# Patient Record
Sex: Male | Born: 1980 | Hispanic: Yes | Marital: Single | Smoking: Never smoker
Health system: Southern US, Community
[De-identification: ages and names within clinical notes are randomized; demographics above are authoritative.]

## PROBLEM LIST (undated history)

## (undated) DIAGNOSIS — N289 Disorder of kidney and ureter, unspecified: Secondary | ICD-10-CM

## (undated) DIAGNOSIS — F419 Anxiety disorder, unspecified: Secondary | ICD-10-CM

## (undated) HISTORY — PX: OTHER SURGICAL HISTORY: SHX169

## (undated) HISTORY — PX: CLEFT PALATE REPAIR: SUR1165

---

## 2013-12-01 ENCOUNTER — Encounter (HOSPITAL_COMMUNITY): Payer: Self-pay | Admitting: Emergency Medicine

## 2013-12-01 ENCOUNTER — Emergency Department (HOSPITAL_COMMUNITY): Payer: Self-pay

## 2013-12-01 ENCOUNTER — Emergency Department (HOSPITAL_COMMUNITY)
Admission: EM | Admit: 2013-12-01 | Discharge: 2013-12-01 | Disposition: A | Discharge status: Home / Self Care | Payer: Self-pay | Attending: Emergency Medicine | Admitting: Emergency Medicine

## 2013-12-01 DIAGNOSIS — F419 Anxiety disorder, unspecified: Secondary | ICD-10-CM

## 2013-12-01 DIAGNOSIS — R079 Chest pain, unspecified: Secondary | ICD-10-CM

## 2013-12-01 DIAGNOSIS — R000 Tachycardia, unspecified: Secondary | ICD-10-CM | POA: Insufficient documentation

## 2013-12-01 DIAGNOSIS — Z79899 Other long term (current) drug therapy: Secondary | ICD-10-CM | POA: Insufficient documentation

## 2013-12-01 DIAGNOSIS — F411 Generalized anxiety disorder: Secondary | ICD-10-CM | POA: Insufficient documentation

## 2013-12-01 DIAGNOSIS — R0789 Other chest pain: Secondary | ICD-10-CM | POA: Insufficient documentation

## 2013-12-01 HISTORY — DX: Anxiety disorder, unspecified: F41.9

## 2013-12-01 LAB — TSH: TSH: 2.48 u[IU]/mL (ref 0.350–4.500)

## 2013-12-01 LAB — CBC WITH DIFFERENTIAL/PLATELET
Basophils Absolute: 0 10*3/uL (ref 0.0–0.1)
Basophils Relative: 0 % (ref 0–1)
Eosinophils Absolute: 0 10*3/uL (ref 0.0–0.7)
Eosinophils Relative: 1 % (ref 0–5)
HEMATOCRIT: 40.9 % (ref 39.0–52.0)
HEMOGLOBIN: 14 g/dL (ref 13.0–17.0)
LYMPHS PCT: 15 % (ref 12–46)
Lymphs Abs: 1 10*3/uL (ref 0.7–4.0)
MCH: 30.1 pg (ref 26.0–34.0)
MCHC: 34.2 g/dL (ref 30.0–36.0)
MCV: 88 fL (ref 78.0–100.0)
MONO ABS: 0.6 10*3/uL (ref 0.1–1.0)
MONOS PCT: 9 % (ref 3–12)
NEUTROS ABS: 5 10*3/uL (ref 1.7–7.7)
NEUTROS PCT: 75 % (ref 43–77)
Platelets: 293 10*3/uL (ref 150–400)
RBC: 4.65 MIL/uL (ref 4.22–5.81)
RDW: 14.2 % (ref 11.5–15.5)
WBC: 6.6 10*3/uL (ref 4.0–10.5)

## 2013-12-01 LAB — BASIC METABOLIC PANEL
BUN: 12 mg/dL (ref 6–23)
CHLORIDE: 98 meq/L (ref 96–112)
CO2: 27 meq/L (ref 19–32)
Calcium: 9.1 mg/dL (ref 8.4–10.5)
Creatinine, Ser: 0.8 mg/dL (ref 0.50–1.35)
GFR calc Af Amer: >90 mL/min (ref >90)
GFR calc non Af Amer: >90 mL/min (ref >90)
Glucose, Bld: 105 mg/dL — ABNORMAL HIGH (ref 70–99)
Potassium: 4.1 mEq/L (ref 3.7–5.3)
Sodium: 137 mEq/L (ref 137–147)

## 2013-12-01 LAB — I-STAT TROPONIN, ED: TROPONIN I, POC: 0 ng/mL (ref 0.00–0.08)

## 2013-12-01 LAB — T4, FREE: FREE T4: 1.27 ng/dL (ref 0.80–1.80)

## 2013-12-01 MED ORDER — LORAZEPAM 1 MG PO TABS
1.0000 mg | ORAL_TABLET | Freq: Once | ORAL | Status: AC
  Administered 2013-12-01: 1 mg via ORAL
  Filled 2013-12-01: qty 1

## 2013-12-01 MED ORDER — LORAZEPAM 1 MG PO TABS: 1.0000 mg | ORAL_TABLET | Freq: Two times a day (BID) | ORAL | Status: DC

## 2013-12-01 NOTE — ED Notes (Signed)
PA at bedside.

## 2013-12-01 NOTE — Discharge Instructions (Signed)
Follow up with [facility] in 2 days, or establish a primary care doctor from the resource guide attached below. Take medication as directed for your anxiety. Caution with driving on medication due to sedative effects. Return to Emergency department if you develop worsening symptom, chest pain, shortness of breath, or dizziness.    Chest Pain (Nonspecific) Chest pain has many causes. Your pain could be caused by something serious, such as a heart attack or a blood clot in the lungs. It could also be caused by something less serious, such as a chest bruise or a virus. Follow up with your doctor. More lab tests or other studies may be needed to find the cause of your pain. Most of the time, nonspecific chest pain will improve within 2 to 3 days of rest and mild pain medicine. HOME CARE  For chest bruises, you may put ice on the sore area for 15-20 minutes, 03-04 times a day. Do this only if it makes you feel better.  Put ice in a plastic bag.  Place a towel between the skin and the bag.  Rest for the next 2 to 3 days.  Go back to work if the pain improves.  See your doctor if the pain lasts longer than 1 to 2 weeks.  Only take medicine as told by your doctor.  Quit smoking if you smoke. GET HELP RIGHT AWAY IF:   There is more pain or pain that spreads to the arm, neck, jaw, back, or belly (abdomen).  You have shortness of breath.  You cough more than usual or cough up blood.  You have very bad back or belly pain, feel sick to your stomach (nauseous), or throw up (vomit).  You have very bad weakness.  You pass out (faint).  You have a fever. Any of these problems may be serious and may be an emergency. Do not wait to see if the problems will go away. Get medical help right away. Call your local emergency services 911 in [place] Do not drive yourself to the hospital. MAKE SURE YOU:   Understand these instructions.  Will watch this condition.  Will  get help right away if you or your child is not doing well or gets worse. Document Released: 02/10/2008 Document Revised: 11/16/2011 Document Reviewed: 02/10/2008 [facility] Patient Information 2014 [place], [place].  Panic Attacks Panic attacks are sudden, short-livedsurges of severe anxiety, fear, or discomfort. They may occur for no reason when you are relaxed, when you are anxious, or when you are sleeping. Panic attacks may occur for a number of reasons:   Healthy people occasionally have panic attacks in extreme, life-threatening situations, such as war or natural disasters. Normal anxiety is a protective mechanism of the body that helps [place] react to danger (fight or flight response).  Panic attacks are often seen with anxiety disorders, such as panic disorder, social anxiety disorder, generalized anxiety disorder, and phobias. Anxiety disorders cause excessive or uncontrollable anxiety. They may interfere with your relationships or other life activities.  Panic attacks are sometimes seen with other mental illnesses such as depression and posttraumatic stress disorder.  Certain medical conditions, prescription medicines, and drugs of abuse can cause panic attacks. SYMPTOMS  Panic attacks start suddenly, peak within 20 minutes, and are accompanied by four or more of the following symptoms:  Pounding heart or fast heart rate (palpitations).  Sweating.  Trembling or shaking.  Shortness of breath or feeling smothered.  Feeling choked.  Chest pain  or discomfort.  Nausea or strange feeling in your stomach.  Dizziness, lightheadedness, or feeling like you will faint.  Chills or hot flushes.  Numbness or tingling in your lips or hands and feet.  Feeling that things are not real or feeling that you are not yourself.  Fear of losing control or going crazy.  Fear of dying. Some of these symptoms can mimic serious medical conditions. For example, you may think you are having a heart  attack. Although panic attacks can be very scary, they are not life threatening. DIAGNOSIS  Panic attacks are diagnosed through an assessment by your health care provider. Your health care provider will ask questions about your symptoms, such as where and when they occurred. Your health care provider will also ask about your medical history and use of alcohol and drugs, including prescription medicines. Your health care provider may order blood tests or other studies to rule out a serious medical condition. Your health care provider may refer you to a mental health professional for further evaluation. TREATMENT   Most healthy people who have one or two panic attacks in an extreme, life-threatening situation will not require treatment.  The treatment for panic attacks associated with anxiety disorders or other mental illness typically involves counseling with a mental health professional, medicine, or a combination of both. Your health care provider will help determine what treatment is best for you.  Panic attacks due to physical illness usually goes away with treatment of the illness. If prescription medicine is causing panic attacks, talk with your health care provider about stopping the medicine, decreasing the dose, or substituting another medicine.  Panic attacks due to alcohol or drug abuse goes away with abstinence. Some adults need professional help in order to stop drinking or using drugs. HOME CARE INSTRUCTIONS   Take all your medicines as prescribed.   Check with your health care provider before starting new prescription or over-the-counter medicines.  Keep all follow up appointments with your health care provider. SEEK MEDICAL CARE IF:  You are not able to take your medicines as prescribed.  Your symptoms do not improve or get worse. SEEK IMMEDIATE MEDICAL CARE IF:   You experience panic attack symptoms that are different than your usual symptoms.  You have serious thoughts  about hurting yourself or others.  You are taking medicine for panic attacks and have a serious side effect. MAKE SURE YOU:  Understand these instructions.  Will watch your condition.  Will get help right away if you are not doing well or get worse. Document Released: 08/24/2005 Document Revised: 06/14/2013 Document Reviewed: 04/07/2013 [facility] Patient Information 2014 [place].

## 2013-12-01 NOTE — ED Notes (Signed)
Pt states on Wednesday he felt like his heart was racing and he felt SOB. He states "my doctor thought i was having a panic attack, he told me to come here on Wednesday but i didn't." states he decided to come today instead. Denies pain or other complaints now.

## 2013-12-01 NOTE — ED Provider Notes (Signed)
CSN: [id number]     Arrival date & time 12/01/13  1112 History   First MD Initiated Contact with Patient 12/01/13 1126     Chief Complaint  Patient presents with  . Tachycardia     (Consider location/radiation/quality/duration/timing/severity/associated sxs/prior Treatment) HPI 33 yo male with hx of anxiety presents with c/o tachycardia. Patient states he felt that he had a "panic attack" on Wednesday 2 days ago with associated feelings of overwhelming fear and "desperation" as well as "dizziness". Patient states he has been having intermittent chest pain x 1 weeks that is worse "when I move, or cough, or carry bags". Patient Denies any associated dizziness or SOB with his episodes of chest pain. Pain rated at 4/10. Pain improved with zantac.  Patient does state he has been feeling more stressed as of late. "quite a bit on my plate". Denies N/V, diaphoresis, abdominal pain, or HA.   CAD Risk Factors: HTN: No Hyperlipidemia: No Cigarette smoking: No Diabetes Mellitus: No Family hx of CAD or MI < 32 yo : No Cocaine Use: No Heart Score: ( a score 0-3 are low risk with less than 2% risk of MACE at 6 weeks.) Suspicion: 0 Suspicious +2 Moderately suspicious  +1  Slightly suspicious  +0  EKG:0 Significant ST-depression  +2 Non-specific repolarisation disturbance  +1  Normal  +0 AGE:34 > 65   +2  45-65  +1  < 45  +0  Risk Factors:0 (Hypercholesterolemia,HTN, DM, Cigarette smoking, Positive Family hx, Obesity) > 3 risk factors  +2  1-2 risk factors  +1 No known risk factors  +0  Troponin:0 > 3 x normal limit +2  1-3 x normal limit +1 < normal limit  +0   Total: 0   Well's Criteria: Suspect DVT : (3) No PE most likely: (3) No HR > 100: (1.5) No Immobilization > 3 days or Surgery w/in 4 wks: (1.5) No Hx of DVT/PE: (1.5) No Hemoptysis: (1) No Hx of Cancer with tx w/in 6 mo: (1) No Total: 0  PERC Criteria: Age > 71 yo: No HR > 100 bpm: Yes O2 sat on RA < 95%: No Prior  hx of DVT/PE:No Trauma or surgery in past 4 wks:No Hemoptysis:No Exogenous Estrogen/Testosterone use:No Unilateral Leg swelling: No      Past Medical History  Diagnosis Date  . Anxiety    Past Surgical History  Procedure Laterality Date  . Cleft palate repair     History reviewed. No pertinent family history. History  Substance Use Topics  . Smoking status: Never Smoker   . Smokeless tobacco: Not on file  . Alcohol Use: Yes    Review of Systems  All other systems reviewed and are negative.      Allergies  Review of patient's allergies indicates no known allergies.  Home Medications   Current Outpatient Rx  Name  Route  Sig  Dispense  Refill  . Multiple Vitamins-Minerals (MULTIVITAL) tablet   Oral   Take 1 tablet by mouth daily.         . ranitidine (ZANTAC) 150 MG tablet   Oral   Take 150 mg by mouth 2 (two) times daily.         Marland Kitchen VALERIAN ROOT PO   Oral   Take 1 tablet by mouth 3 (three) times daily as needed (relaxation).         . LORazepam (ATIVAN) 1 MG tablet   Oral   Take 1 tablet (1 mg total) by mouth 2 (two)  times daily.   10 tablet   0    BP 138/95  Pulse 109  Temp(Src) 98.3 F (36.8 C) (Oral)  Resp 20  SpO2 99% Physical Exam  Nursing note and vitals reviewed. Constitutional: He is oriented to person, place, and time. He appears well-developed and well-nourished. No distress.  HENT:  Head: Normocephalic and atraumatic.  Right Ear: Tympanic membrane and ear canal normal.  Left Ear: Tympanic membrane and ear canal normal.  Nose: Nose normal. Right sinus exhibits no maxillary sinus tenderness and no frontal sinus tenderness. Left sinus exhibits no maxillary sinus tenderness and no frontal sinus tenderness.  Mouth/Throat: Uvula is midline, oropharynx is clear and moist and mucous membranes are normal. No oropharyngeal exudate, posterior oropharyngeal edema or posterior oropharyngeal erythema.  Eyes: Conjunctivae and EOM are normal.  Pupils are equal, round, and reactive to light. Right eye exhibits no discharge. Left eye exhibits no discharge. No scleral icterus.  Neck: Trachea normal, normal range of motion and phonation normal. Neck supple. No JVD present. Carotid bruit is not present. No rigidity. No tracheal deviation, no edema and no erythema present.  Cardiovascular: Normal rate and regular rhythm.  Exam reveals no gallop and no friction rub.   No murmur heard. Pulmonary/Chest: Effort normal and breath sounds normal. No stridor. No respiratory distress. He has no wheezes. He has no rhonchi. He has no rales.  Abdominal: Soft. He exhibits no distension. There is no tenderness.  Musculoskeletal: Normal range of motion. He exhibits no edema.  Lymphadenopathy:    He has no cervical adenopathy.  Neurological: He is alert and oriented to person, place, and time. He has normal strength. No cranial nerve deficit or sensory deficit.  CN II-XII grossly intact. Cerebellar function appears intact with finger to nose.   Skin: Skin is warm and dry. He is not diaphoretic.  Psychiatric: He has a normal mood and affect. His behavior is normal.    ED Course  Procedures (including critical care time) Labs Review Labs Reviewed  BASIC METABOLIC PANEL - Abnormal; Notable for the following:    Glucose, Bld 105 (*)    All other components within normal limits  CBC WITH DIFFERENTIAL  TSH  T4, FREE  I-STAT TROPOININ, ED   Imaging Review Dg Chest 2 View  12/01/2013   CLINICAL DATA:  Anxiety.  EXAM: CHEST  2 VIEW  COMPARISON:  None.  FINDINGS: Mediastinum and hilar structures are normal. The lungs are clear of acute infiltrates. Mild interstitial prominence most likely related to scarring. Elevation of the left hemidiaphragm is noted. No pleural effusion noted. No pneumothorax. Heart size and pulmonary vascularity normal. No acute osseous abnormality.  IMPRESSION: 1. Minimal interstitial prominence  most likely scarring. 2. Mild elevation  left hemidiaphragm. No pleural effusion identified. No acute cardiopulmonary disease identified.   Electronically Signed   By: Marcello Moores  Register   On: 12/01/2013 15:05     EKG Interpretation None      MDM   Final diagnoses:  Racing heart beat  Chest pain  Anxiety   Troponin negative No anemia  TSH/T4 are WNL Heart Score = 0. Doubt ACS.  Patient is low risk for PE by Well's Criteria. Doubt PE. CXR shows no evidence of widened mediastinum, pneumoperitoneum, or pneumomediastinum. Doubt aortic dissection or esophageal rupture. no Cardiomegaly on CXR and no evidence of low voltage on EKG. Doubt Pericardial tamponade.  Patient has hx of anxiety and appears anxious on exam. Suspect patient sxs likely related to underlying anxiety.  Discussed labs, and exam findings with patient. Advised follow up with [place] in 2 days. Recommend return to ED should symptoms worsen progressive chest pain, SOB, dizziness, or fever/chills. Patient agrees with plan. Discharged in good condition.     Meds given in ED:  Medications  LORazepam (ATIVAN) tablet 1 mg (1 mg Oral Given 12/01/13 1440)    Discharge Medication List as of 12/01/2013  4:24 PM    START taking these medications   Details  LORazepam (ATIVAN) 1 MG tablet Take 1 tablet (1 mg total) by mouth 2 (two) times daily., Starting 12/01/2013, Until Discontinued, Print          Dossie Arbour [place], [place] 12/02/13 [phone]

## 2013-12-05 NOTE — ED Provider Notes (Signed)
Medical screening examination/treatment/procedure(s) were performed by non-physician practitioner and as supervising physician I was immediately available for consultation/collaboration.   EKG Interpretation   Date/Time:  Friday December 01 2013 15:46:43 EDT Ventricular Rate:  102 PR Interval:  124 QRS Duration: 85 QT Interval:  326 QTC Calculation: 425 R Axis:   58 Text Interpretation:  Sinus tachycardia Probable left atrial enlargement  RSR' in V1 or V2, right VCD or RVH ED PHYSICIAN INTERPRETATION AVAILABLE  IN [facility] Confirmed by TEST, Record ([id number]) on 12/03/2013 12:51:26  PM        EKG Interpretation   Date/Time:  Friday December 01 2013 15:46:43 EDT Ventricular Rate:  102 PR Interval:  124 QRS Duration: 85 QT Interval:  326 QTC Calculation: 425 R Axis:   58 Text Interpretation:  Sinus tachycardia Probable left atrial enlargement  RSR' in V1 or V2, right VCD or RVH ED PHYSICIAN INTERPRETATION AVAILABLE  [facility] Confirmed by TEST, Record ([id number]) on 12/03/2013 12:51:26  PM        Tanna Furry, MD 12/05/13 [id number]

## 2015-11-05 ENCOUNTER — Emergency Department (HOSPITAL_COMMUNITY)
Admission: EM | Admit: 2015-11-05 | Discharge: 2015-11-05 | Disposition: A | Discharge status: Home / Self Care | Payer: Self-pay | Attending: Emergency Medicine | Admitting: Emergency Medicine

## 2015-11-05 ENCOUNTER — Encounter (HOSPITAL_COMMUNITY): Payer: Self-pay | Admitting: Emergency Medicine

## 2015-11-05 DIAGNOSIS — Z79899 Other long term (current) drug therapy: Secondary | ICD-10-CM | POA: Insufficient documentation

## 2015-11-05 DIAGNOSIS — T148XXA Other injury of unspecified body region, initial encounter: Secondary | ICD-10-CM

## 2015-11-05 DIAGNOSIS — Z791 Long term (current) use of non-steroidal anti-inflammatories (NSAID): Secondary | ICD-10-CM | POA: Insufficient documentation

## 2015-11-05 DIAGNOSIS — F419 Anxiety disorder, unspecified: Secondary | ICD-10-CM | POA: Insufficient documentation

## 2015-11-05 DIAGNOSIS — Y9389 Activity, other specified: Secondary | ICD-10-CM | POA: Insufficient documentation

## 2015-11-05 DIAGNOSIS — X58XXXA Exposure to other specified factors, initial encounter: Secondary | ICD-10-CM | POA: Insufficient documentation

## 2015-11-05 DIAGNOSIS — S39011A Strain of muscle, fascia and tendon of abdomen, initial encounter: Secondary | ICD-10-CM | POA: Insufficient documentation

## 2015-11-05 DIAGNOSIS — Y9289 Other specified places as the place of occurrence of the external cause: Secondary | ICD-10-CM | POA: Insufficient documentation

## 2015-11-05 DIAGNOSIS — Y998 Other external cause status: Secondary | ICD-10-CM | POA: Insufficient documentation

## 2015-11-05 LAB — CBC
HEMATOCRIT: 39.9 % (ref 39.0–52.0)
Hemoglobin: 14.1 g/dL (ref 13.0–17.0)
MCH: 29.7 pg (ref 26.0–34.0)
MCHC: 35.3 g/dL (ref 30.0–36.0)
MCV: 84.2 fL (ref 78.0–100.0)
PLATELETS: 324 10*3/uL (ref 150–400)
RBC: 4.74 MIL/uL (ref 4.22–5.81)
RDW: 13.8 % (ref 11.5–15.5)
WBC: 5.9 10*3/uL (ref 4.0–10.5)

## 2015-11-05 LAB — COMPREHENSIVE METABOLIC PANEL
ALT: 37 U/L (ref 17–63)
ANION GAP: 11 (ref 5–15)
AST: 29 U/L (ref 15–41)
Albumin: 4.4 g/dL (ref 3.5–5.0)
Alkaline Phosphatase: 47 U/L (ref 38–126)
BILIRUBIN TOTAL: 0.8 mg/dL (ref 0.3–1.2)
BUN: 14 mg/dL (ref 6–20)
CO2: 24 mmol/L (ref 22–32)
Calcium: 9.7 mg/dL (ref 8.9–10.3)
Chloride: 104 mmol/L (ref 101–111)
Creatinine, Ser: 0.94 mg/dL (ref 0.61–1.24)
GFR calc non Af Amer: >60 mL/min (ref >60)
Glucose, Bld: 99 mg/dL (ref 65–99)
Potassium: 3.9 mmol/L (ref 3.5–5.1)
Sodium: 139 mmol/L (ref 135–145)
Total Protein: 7.3 g/dL (ref 6.5–8.1)

## 2015-11-05 LAB — LIPASE, BLOOD: Lipase: 31 U/L (ref 11–51)

## 2015-11-05 MED ORDER — NAPROXEN 500 MG PO TABS: 500.0000 mg | ORAL_TABLET | Freq: Two times a day (BID) | ORAL | Status: DC

## 2015-11-05 NOTE — Discharge Instructions (Signed)
Take your medications as prescribed. You may also apply ice or heat to affected area for 15-20 minutes 3-4 times daily to help with pain. Refrain from doing any heavy lifting, excessive exercising or repetitive movements that exacerbate her symptoms. Please follow up with a primary care provider from the Resource Guide provided below in 3-4 days as needed. Please return to the Emergency Department if symptoms worsen or new onset of fever, abdominal pain, vomiting, diarrhea, constipation, blood in urine or stool.   Emergency Department Resource Guide 1) Find a Doctor and Pay Out of Pocket Although you won't have to find out who is covered by your insurance plan, it is a good idea to ask around and get recommendations. You will then need to call the office and see if the doctor you have chosen will accept you as a new patient and what types of options they offer for patients who are self-pay. Some doctors offer discounts or will set up payment plans for their patients who do not have insurance, but you will need to ask so you aren't surprised when you get to your appointment.  2) Contact Your Local Health Department Not all health departments have doctors that can see patients for sick visits, but many do, so it is worth a call to see if yours does. If you don't know where your local health department is, you can check in your phone book. The CDC also has a tool to help you locate your state's health department, and many state websites also have listings of all of their local health departments.  3) Find a [facility] If your illness is not likely to be very severe or complicated, you may want to try a walk in clinic. These are popping up all over the country in pharmacies, drugstores, and shopping centers. They're usually staffed by nurse practitioners or physician assistants that have been trained to treat common illnesses and complaints. They're usually fairly quick and inexpensive. However, if you  have serious medical issues or chronic medical problems, these are probably not your best option.  No Primary Care Doctor: - Call Health Connect at  [phone] - they can help you locate a primary care doctor that  accepts your insurance, provides certain services, etc. - Physician Referral Service- [phone]  Chronic Pain Problems: Organization         Address  Phone   Notes  [facility]  [phone] Patients need to be referred by their primary care doctor.   Medication Assistance: Organization         Address  Phone   Notes  [facility] Medication [facility] [address] [place], [place] [address] [phone] --Must be a resident of [facility] -- Must have NO insurance coverage whatsoever (no Medicaid/ Medicare, etc.) -- The pt. MUST have a primary care doctor that directs their care regularly and follows them in the community   [facility]ist  [phone]   [employer]  [phone]    Agencies that provide inexpensive medical care: Organization         Address  Phone   Notes  [facility]  [phone]   [facility]    [phone]   [facility] [place], [place] [address] [phone]   [place] [address] [place] [phone]   Planned Parenthood    [phone]   [place]  [facility]    [phone]   [facility] and [facility]  [address], [place] Phone:  [phone], Fax:  [phone] Hours of Operation:  9 am - 6 pm, M-F.  Also accepts Medicaid/Medicare and self-pay.  [facility] for [place], [address], [place] Phone: [phone], Fax: [phone]. Hours of Operation:  8:30 am - 5:30 pm, M-F.  Also accepts Medicaid and self-pay.  [facility] [place] [address] [place] Phone: [phone]   [place], [place], [place] [phone], Ext. [phone] Mondays & Thursdays: 7-9 AM.  First 15 patients are seen on a first come, first serve basis.    [place] Providers:  Organization         Address  Phone   Notes  [facility] [address], [place] [phone] Also accepts self-pay patients.  [facility] [id number] [place], [place]  [phone]   [place], [address], [place] [phone]   [facility] [address], [place] [phone]   Lucianne Lei [address], [place]   [phone] Only accepts [place] Access [place] patients after they have their name applied to their card.   Self-Pay (no insurance) in [facility]:  Organization         Address  Phone   Notes  Sickle Cell Patients, [facility] [place] [phone]   [facility] [place] [phone]   [facility] [place]  [place], [place], [place] [phone]   [facility]/Dr. Osei-Bonsu  [address] [place] or [address], [place] [phone] Phone number for both [address] and [place] locations is the same.  [facility]gent [facility] and [facility] [address] [place] [phone]   [facility] [address] [place] or [address] [phone]   [facility] [address], [place] [phone], phone; [phone], fax Sees patients 1st and 3rd Saturday of every month.  Must not qualify for public or private insurance (i.e. Medicaid, Medicare, [place] Health Choice, Veterans' Benefits)  Household income should be no more than 200% of the poverty level The clinic cannot treat you if you are pregnant or think you are pregnant   Sexually transmitted diseases are not treated at the clinic.    Dental Care: Organization         Address  Phone  Notes  [facility] Department of [facility] [place] [phone] Accepts children up to age 64 who are enrolled in [place] or [place]; pregnant women with a Medicaid card; and children who have applied for Medicaid or [place] Health Choice, but were declined, whose parents can pay a reduced fee at time of service.  [facility] Department of [facility]  [address], [place] [phone] Accepts children up to age 6 who are enrolled in [place] or [place]; pregnant women with a Medicaid card; and children who have applied for Medicaid or [place] Health Choice, but were declined, whose parents can pay a reduced fee at time of  service.  [facility]  [place] [phone] Patients are seen by appointment only. Walk-ins are not accepted. [place] will see patients 58 years of age and older. Monday - Tuesday (8am-5pm) Most Wednesdays (8:30-5pm) $30 per visit, cash only  [facility] Adult Dental Access PROGRAM  [address], [facility] [phone] Patients are seen by appointment only. Walk-ins are not accepted. [place] will see patients 28 years of age and older. One Wednesday Evening (Monthly: Volunteer Based).  $30 per visit, cash only  [place]  [phone] for adults; Children under age 3, call Graduate Pediatric Dentistry at [phone]. Children aged 84-14, please call [phone] to request a pediatric application.  Dental services are provided in all areas of dental care including fillings, crowns and bridges, complete and partial dentures, implants, gum treatment, root canals, and extractions. Preventive care is also provided. Treatment is provided to both adults and children. Patients  are selected via a lottery and there is often a waiting list.   [facility] [address], [place]  [phone] [email or web address]   [facility] [address] [place], [place] [phone], Ext. [phone] Second and Fourth Thursday of each month, opens at 6:30 AM; Clinic ends at 9 AM.  Patients are seen on a first-come first-served basis, and a limited number are seen during each clinic.   [facility]  [address] [place] Danker [place], [place] [phone]   Eligibility Requirements You must have lived in [place], [place], or [place] counties for at least the last three months.   You cannot be eligible for state or federal sponsored [employer], including [employer], [place], or [employer].   You generally cannot be eligible for healthcare insurance through your employer.    How to apply: Eligibility screenings are held every Tuesday and Wednesday afternoon from 1:00 pm until 4:00 pm. You do not need an appointment for the interview!  [facility] [address], [place], [place]   [place]  [place] Department  [place]  [phone]    Behavioral Health Resources in the Community: Intensive Outpatient Programs Organization         Address  Phone  Notes  [place]. [address] [place], [place] [phone]   [facility] [address], [place], [place]   ADS: Alcohol & Drug S[facility]cs [address], [place], [place]   [place] [address],  [place], [place] or [phone]   Substance Abuse Resources Organization         Address  Phone  Notes  Alcohol and Drug Services  [phone]   [place]  [phone]   [place]   Chinita Pester  [phone]    Residential & Outpatient Substance Abuse Program  [phone]   Psychological Services Organization         Address  Phone  Notes  [facility] [place]  [place]  [phone]   Central [address] [place] [phone] or [phone]    Mobile Crisis Teams Organization         Address  Phone  Notes  [facility], Mobile Crisis Care Unit  [phone]   Assertive Psychotherapeutic Services  [place], [place]   [facility] [address], [place] [phone]    Self-Help/Support Groups Organization         Address  Phone             Notes  [place]. of [place] - variety of support groups  [place] Call for more information  Narcotics Anonymous (NA), [facility] [address], [employer] [place]  2 meetings at this location   Special educational needs teacher         Address  Phone  Notes  [facility] Residential Treatment [place],    [place]  [phone]   [facility]  [address], [place] [phone], [place], [place]   [place], [place] [phone] Admissions: 8am-3pm M-F  Incentives Substance [place] [address],    [place], [place] [phone]   [facility] [address] [place], [place], [place]   [facility] [address]  [place], [place]   [facility] - Intensive Outpatient [address] Kristeen [address], [place], [place]   [employer].,  [place], [place] [phone] or [phone]   [facility] Services (RTS) [address], [place], [place] Accepts Medicaid  [address] [place] [place] [address],  [place] [phone] Substance Abuse/Addiction Treatment   [facility] Organization         Address  Phone  Notes  [facility]  [phone]   Domenic Schwab, PhD [address] Arlis [place], [place]   [phone] or [phone]   [place], [place] [phone]   [facility] [address], [place], [place] [phone] Insurance/Medicaid/sponsorship through [facility] Families [address], [address] [place]                                    [place], [place] [phone] [place] [address] [place] [address], [place] [phone]    Dr. Adele Schilder  [phone]   [facility] Dept. 1) [address] [place] 2) [place] 3)  [place] [address], [place] [phone]  [phone]   [place] [phone] or [phone] (After Hours)

## 2015-11-05 NOTE — ED Provider Notes (Signed)
CSN: [id number]     Arrival date & time 11/05/15  1314 History  By signing my name below, I, Jamie Rivers, attest that this documentation has been prepared under the direction and in the presence of Jamie Rivers, [place]. Electronically Signed: Stephania Rivers, ED Scribe. 11/05/2015. 4:30 PM.    Chief Complaint  Patient presents with  . Abdominal Pain   The history is provided by the patient. No language interpreter was used.   HPI Comments:  Jamie Rivers is a 35 y.o. male with a history of anxiety, who presents to the Emergency Department complaining of dull, occasionally throbbing, moderate LUQ abdominal pain occasionally radiating into his left back that began 1 months ago. He states the pain started out as intermittent but became constant over the past week. Eating does not seem to affect his symptoms. Position changes sometimes alleviate his symptoms. He denies any trauma or injury to the area, recent heavy lifting, or new exercises. 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 Patient states he has been able to eat and drink normally, and his last normal BM occurred late this morning. He notes he has been taking a daily multivitamin and supplementary OTC digestive enzymes. He denies a history of abdominal surgeries. He also denies a history of any chronic medical conditions, including DM, or any regular medications. He denies nausea, vomiting, fever, chills, generalized myalgias, chest pain, SOB, diarrhea, constipation, dysuria, hematuria, or blood in his stool, numbness, tingling or weakness.  PCP: None  Past Medical History  Diagnosis Date  . Anxiety    Past Surgical History  Procedure Laterality Date  . Cleft palate repair     No family history on file. Social History  Substance Use Topics  . Smoking status: Never Smoker   . Smokeless tobacco: None  . Alcohol Use: Yes    Review of Systems  Constitutional: Negative for fever and chills.  Respiratory: Negative for shortness of breath.    Cardiovascular: Negative for chest pain.  Gastrointestinal: Positive for abdominal pain. Negative for nausea, vomiting, diarrhea, constipation and blood in stool.  Genitourinary: Negative for dysuria and hematuria.  Musculoskeletal: Negative for myalgias.  All other systems reviewed and are negative.     Allergies  Review of patient's allergies indicates no known allergies.  Home Medications   Prior to Admission medications   Medication Sig Start Date End Date Taking? Authorizing Provider  LORazepam (ATIVAN) 1 MG tablet Take 1 tablet (1 mg total) by mouth 2 (two) times daily. 12/01/13   Maude Leriche, PA-C  Multiple Vitamins-Minerals (MULTIVITAL) tablet Take 1 tablet by mouth daily.    Historical Provider, MD  naproxen (NAPROSYN) 500 MG tablet Take 1 tablet (500 mg total) by mouth 2 (two) times daily. 11/05/15   Nona Dell, PA-C  ranitidine (ZANTAC) 150 MG tablet Take 150 mg by mouth 2 (two) times daily.    Historical Provider, MD  VALERIAN ROOT PO Take 1 tablet by mouth 3 (three) times daily as needed (relaxation).    Historical Provider, MD   BP 111/75 mmHg  Pulse 91  Temp(Src) 98.3 F (36.8 C) (Oral)  Resp 16  SpO2 100% Physical Exam  Constitutional: He is oriented to person, place, and time. He appears well-developed and well-nourished.  HENT:  Head: Normocephalic and atraumatic.  Eyes: Conjunctivae and EOM are normal. Right eye exhibits no discharge. Left eye exhibits no discharge. No scleral icterus.  Cardiovascular: Normal rate, regular rhythm, normal heart sounds and intact distal pulses.  Pulmonary/Chest: Effort normal and breath sounds normal.  Abdominal: Soft. Bowel sounds are normal. There is no tenderness.  Musculoskeletal: He exhibits tenderness. He exhibits no edema.  Left lateral external oblique mildly TTP.  Neurological: He is alert and oriented to person, place, and time.  Skin: Skin is warm and dry.  Nursing note and vitals reviewed.   ED  Course  Procedures (including critical care time)  DIAGNOSTIC STUDIES: Oxygen Saturation is 99% on RA, normal by my interpretation.    COORDINATION OF CARE: 3:37 PM - Lab results reviewed with pt. Suspect muscle strain. Discussed treatment plan with pt at bedside which includes ibuprofen. Discouraged heavy lifting or strenuous activity. Will also give referral to PCP; advised pt to make a follow-up visit with PCP if his pain does not improve in 1 week. Strict return precautions given, and pt instructed to return to the ED immediately if he develops any fever, nausea, vomiting, worsening abdominal pain, or diarrhea. Pt verbalized understanding and agreed to plan.   Labs Review Labs Reviewed  LIPASE, BLOOD  COMPREHENSIVE METABOLIC PANEL  CBC    MDM   Final diagnoses:  Muscle strain   Patient presents with left-sided abdominal pain. VSS. Exam revealed mild tenderness over left lateral external oblique, no abdominal tenderness, no peritoneal signs, remaining exam unremarkable. No back pain red flags. Labs unremarkable. I suspect patient's symptoms are likely due to to muscle strain and do not feel any further workup or imaging is warranted at this time. Discussed results and plan for discharge with symptomatic treatment with patient. Patient given resource guide to follow up with PCP.  Evaluation does not show pathology requring ongoing emergent intervention or admission. Pt is hemodynamically stable and mentating appropriately. Discussed findings/results and plan with patient/guardian, who agrees with plan. All questions answered. Return precautions discussed and outpatient follow up given.    I personally performed the services described in this documentation, which was scribed in my presence. The recorded information has been reviewed and is accurate.      Jamie Rivers, [place] 11/05/15 1913  Tanna Furry, MD 11/18/15 [phone]

## 2015-11-05 NOTE — ED Notes (Signed)
Pt states for the last month he has had intermittent left upper abd pain that over the last week and became more persistent. Pt denies any n/v/d. Pt also states he times the pain radiates into the left mid back.

## 2015-11-05 NOTE — ED Notes (Signed)
PA at bedside.

## 2016-04-02 ENCOUNTER — Ambulatory Visit (INDEPENDENT_AMBULATORY_CARE_PROVIDER_SITE_OTHER): Payer: Self-pay | Admitting: Urgent Care

## 2016-04-02 ENCOUNTER — Ambulatory Visit (INDEPENDENT_AMBULATORY_CARE_PROVIDER_SITE_OTHER): Payer: Self-pay

## 2016-04-02 VITALS — BP 110/78 | HR 84 | Temp 98.0°F | Resp 17 | Wt 108.0 lb

## 2016-04-02 DIAGNOSIS — D649 Anemia, unspecified: Secondary | ICD-10-CM

## 2016-04-02 DIAGNOSIS — R109 Unspecified abdominal pain: Secondary | ICD-10-CM

## 2016-04-02 LAB — POCT URINALYSIS DIP (MANUAL ENTRY)
BILIRUBIN UA: NEGATIVE
BILIRUBIN UA: NEGATIVE
Blood, UA: NEGATIVE
Glucose, UA: NEGATIVE
Leukocytes, UA: NEGATIVE
Nitrite, UA: NEGATIVE
Protein Ur, POC: NEGATIVE
SPEC GRAV UA: 1.01
Urobilinogen, UA: 0.2
pH, UA: 5.5

## 2016-04-02 LAB — COMPLETE METABOLIC PANEL WITH GFR
ALBUMIN: 4.7 g/dL (ref 3.6–5.1)
ALT: 30 U/L (ref 9–46)
AST: 23 U/L (ref 10–40)
Alkaline Phosphatase: 40 U/L (ref 40–115)
BUN: 15 mg/dL (ref 7–25)
CO2: 26 mmol/L (ref 20–31)
Calcium: 9.3 mg/dL (ref 8.6–10.3)
Chloride: 103 mmol/L (ref 98–110)
Creat: 1.08 mg/dL (ref 0.60–1.35)
GFR, Est African American: >89 mL/min (ref >=60)
GFR, Est Non African American: 88 mL/min (ref >=60)
Glucose, Bld: 79 mg/dL (ref 65–99)
POTASSIUM: 3.7 mmol/L (ref 3.5–5.3)
Sodium: 140 mmol/L (ref 135–146)
TOTAL PROTEIN: 7.6 g/dL (ref 6.1–8.1)
Total Bilirubin: 0.5 mg/dL (ref 0.2–1.2)

## 2016-04-02 LAB — POCT CBC
GRANULOCYTE PERCENT: 60 % (ref 37–80)
HEMATOCRIT: 37 % — AB (ref 43.5–53.7)
Hemoglobin: 13.1 g/dL — AB (ref 14.1–18.1)
Lymph, poc: 1.6 (ref 0.6–3.4)
MCH, POC: 29.8 pg (ref 27–31.2)
MCHC: 35.5 g/dL — AB (ref 31.8–35.4)
MCV: 83.9 fL (ref 80–97)
MID (CBC): 0.4 (ref 0–0.9)
MPV: 7.8 fL (ref 0–99.8)
POC Granulocyte: 3 (ref 2–6.9)
POC LYMPH PERCENT: 31.7 %L (ref 10–50)
POC MID %: 8.3 %M (ref 0–12)
Platelet Count, POC: 288 10*3/uL (ref 142–424)
RBC: 4.4 M/uL — AB (ref 4.69–6.13)
RDW, POC: 13.3 %
WBC: 5 10*3/uL (ref 4.6–10.2)

## 2016-04-02 LAB — POC MICROSCOPIC URINALYSIS (UMFC): Mucus: ABSENT

## 2016-04-02 LAB — FERRITIN: Ferritin: 269 ng/mL (ref 20–345)

## 2016-04-02 NOTE — Progress Notes (Signed)
MRN: [id number] DOB: 10/20/1980  Subjective:   Jamie Rivers is a 35 y.o. male presenting for chief complaint of Flank Pain (Left sided. Pt states dx with "muscle strain" in feb/2017)  Reports ~6 month history of left sided abdominal pain, flank pain. The pain is constant, feels a tightness, radiates to his left back and LLQ at times. Patient eats fiber supplement and also states that he uses otc digestive enzymes. Was seen before at the ED. Patient was drinking 4-6 beers per night and ended up cutting back to 1 drink per week. Denies fevers, n/v, bloody stools, constipation, rashes, trauma. Denies smoking cigarettes.  Jamie Rivers has a current medication list which includes the following prescription(s): digestive enzymes, multivital, naproxen, lorazepam, and ranitidine. Also has No Known Allergies.  Jamie Rivers  has a past medical history of Anxiety. Also  has a past surgical history that includes Cleft palate repair.  His family history includes Hypertension in his mother.   Objective:   Vitals: BP 110/78 (BP Location: Right Arm, Patient Position: Sitting, Cuff Size: Normal)   Pulse 84   Temp 98 F (36.7 C) (Oral)   Resp 17   Wt 108 lb (49 kg)   SpO2 100%    Physical Exam  Constitutional: He is oriented to person, place, and time. He appears well-developed and well-nourished.  HENT:  Mouth/Throat: Oropharynx is clear and moist.  Cardiovascular: Normal rate, regular rhythm and intact distal pulses.  Exam reveals no gallop and no friction rub.   No murmur heard. Pulmonary/Chest: No respiratory distress. He has no wheezes. He has no rales.  Abdominal: Soft. Bowel sounds are normal. He exhibits no distension and no mass. There is tenderness (LLQ>LUQ). There is no guarding.  Neurological: He is alert and oriented to person, place, and time.  Skin: Skin is warm and dry. No rash noted.    Dg Abd 1 View  Result Date: 04/02/2016 CLINICAL DATA:  Six-month history of left-sided abdomen pain and  flank pain EXAM: ABDOMEN - 1 VIEW COMPARISON:  None. FINDINGS: Supine views of the abdomen show no bowel obstruction. A linear and rounded opacity overlying the right abdomen most likely is artifactual, appearing to extend beyond the patient. There is feces throughout the colon. No opaque calculi are noted. The bones are unremarkable. IMPRESSION: 1. No bowel obstruction.  No free air. 2. No opaque calculi. Electronically Signed   By: Ivar Drape M.D.   On: 04/02/2016 16:32   Results for orders placed or performed in visit on 04/02/16 (from the past 24 hour(s))  POCT urinalysis dipstick     Status: None   Collection Time: 04/02/16  4:03 PM  Result Value Ref Range   Color, UA yellow yellow   Clarity, UA clear clear   Glucose, UA negative negative   Bilirubin, UA negative negative   Ketones, POC UA negative negative   Spec Grav, UA 1.010    Blood, UA negative negative   pH, UA 5.5    Protein Ur, POC negative negative   Urobilinogen, UA 0.2    Nitrite, UA Negative Negative   Leukocytes, UA Negative Negative  POCT Microscopic Urinalysis ([facility])     Status: None   Collection Time: 04/02/16  4:07 PM  Result Value Ref Range   WBC,UR,HPF,POC None None WBC/hpf   RBC,UR,HPF,POC None None RBC/hpf   Bacteria None None, Too numerous to count   Mucus Absent Absent   Epithelial Cells, UR Per Microscopy None None, Too numerous to count cells/hpf  POCT CBC     Status: Abnormal   Collection Time: 04/02/16  4:20 PM  Result Value Ref Range   WBC 5.0 4.6 - 10.2 K/uL   Lymph, poc 1.6 0.6 - 3.4   POC LYMPH PERCENT 31.7 10 - 50 %L   MID (cbc) 0.4 0 - 0.9   POC MID % 8.3 0 - 12 %M   POC Granulocyte 3.0 2 - 6.9   Granulocyte percent 60.0 37 - 80 %G   RBC 4.40 (A) 4.69 - 6.13 M/uL   Hemoglobin 13.1 (A) 14.1 - 18.1 g/dL   HCT, POC 37.0 (A) 43.5 - 53.7 %   MCV 83.9 80 - 97 fL   MCH, POC 29.8 27 - 31.2 pg   MCHC 35.5 (A) 31.8 - 35.4 g/dL   RDW, POC 13.3 %   Platelet Count, POC 288 142 - 424 K/uL   MPV  7.8 0 - 99.8 fL   Assessment and Plan :   1. Left sided abdominal pain 2. Flank pain 3. Anemia, unspecified - Unclear etiology, may be related to his excessive drinking which he has now cut back. I also had him stop using otc pancreatic enzymes, recommended adequate hydration, light meals. Patient declined hemoccult tests, would like to pursue an abdominal CT instead.  Jaynee Eagles, PA-C [facility] [phone] 04/02/2016 3:40 PM

## 2016-04-02 NOTE — Patient Instructions (Addendum)
Please report to [facility] on tomorrow 04/03/2016 for your scheduled CT Go to the 1st Floor Radiology. Please stay after your exam is over for further instructions    Abdominal Pain, Adult Many things can cause abdominal pain. Usually, abdominal pain is not caused by a disease and will improve without treatment. It can often be observed and treated at home. Your health care provider will do a physical exam and possibly order blood tests and X-rays to help determine the seriousness of your pain. However, in many cases, more time must pass before a clear cause of the pain can be found. Before that point, your health care provider may not know if you need more testing or further treatment. HOME CARE INSTRUCTIONS Monitor your abdominal pain for any changes. The following actions may help to alleviate any discomfort you are experiencing:  Only take over-the-counter or prescription medicines as directed by your health care provider.  Do not take laxatives unless directed to do so by your health care provider.  Try a clear liquid diet (broth, tea, or water) as directed by your health care provider. Slowly move to a bland diet as tolerated. SEEK MEDICAL CARE IF:  You have unexplained abdominal pain.  You have abdominal pain associated with nausea or diarrhea.  You have pain when you urinate or have a bowel movement.  You experience abdominal pain that wakes you in the night.  You have abdominal pain that is worsened or improved by eating food.  You have abdominal pain that is worsened with eating fatty foods.  You have a fever. SEEK IMMEDIATE MEDICAL CARE IF:  Your pain does not go away within 2 hours.  You keep throwing up (vomiting).  Your pain is felt only in portions of the abdomen, such as the right side or the left lower portion of the abdomen.  You pass bloody or black tarry stools. MAKE SURE YOU:  Understand these instructions.  Will watch your  condition.  Will get help right away if you are not doing well or get worse.   This information is not intended to replace advice given to you by your health care provider. Make sure you discuss any questions you have with your health care provider.   Document Released: 06/03/2005 Document Revised: 05/15/2015 Document Reviewed: 05/03/2013 Elsevier Interactive Patient Education 2016 [employer].     IF you received an x-ray today, you will receive an invoice from [facility]. Please contact [facility] at [phone] with questions or concerns regarding your invoice.   IF you received labwork today, you will receive an invoice from [employer]. Please contact [facility] at [phone] with questions or concerns regarding your invoice.   Our billing staff will not be able to assist you with questions regarding bills from these companies.  You will be contacted with the lab results as soon as they are available. The fastest way to get your results is to activate your My Chart account. Instructions are located on the last page of this paperwork. If you have not heard from [place] regarding the results in 2 weeks, please contact this office.

## 2016-04-03 ENCOUNTER — Encounter (HOSPITAL_COMMUNITY): Payer: Self-pay

## 2016-04-03 ENCOUNTER — Ambulatory Visit (HOSPITAL_COMMUNITY)
Admission: RE | Admit: 2016-04-03 | Discharge: 2016-04-03 | Disposition: A | Discharge status: Home / Self Care | Payer: Self-pay | Source: Ambulatory Visit | Attending: Urgent Care | Admitting: Urgent Care

## 2016-04-03 DIAGNOSIS — R109 Unspecified abdominal pain: Secondary | ICD-10-CM | POA: Insufficient documentation

## 2016-04-03 DIAGNOSIS — N2889 Other specified disorders of kidney and ureter: Secondary | ICD-10-CM | POA: Insufficient documentation

## 2016-04-03 LAB — PATHOLOGIST SMEAR REVIEW

## 2016-04-03 MED ORDER — DIATRIZOATE MEGLUMINE & SODIUM 66-10 % PO SOLN
30.0000 mL | Freq: Once | ORAL | Status: AC
  Administered 2016-04-03: 30 mL via ORAL

## 2016-04-03 MED ORDER — IOPAMIDOL (ISOVUE-300) INJECTION 61%
100.0000 mL | Freq: Once | INTRAVENOUS | Status: AC | PRN
  Administered 2016-04-03: 100 mL via INTRAVENOUS

## 2016-04-04 ENCOUNTER — Telehealth: Payer: Self-pay | Admitting: Radiology

## 2016-04-04 NOTE — Telephone Encounter (Signed)
Patient calling for CT scan report. I do not see a call report, or a note from you, will you please review scan and call patient at your earliest convenience ?

## 2016-04-06 ENCOUNTER — Other Ambulatory Visit: Payer: Self-pay | Admitting: Urgent Care

## 2016-04-06 DIAGNOSIS — N2889 Other specified disorders of kidney and ureter: Secondary | ICD-10-CM

## 2016-04-06 NOTE — Telephone Encounter (Signed)
Called patient back see results note from 04/06/2016.

## 2016-04-06 NOTE — Telephone Encounter (Signed)
Pt calling again for CT results. Informed pt that they have not been viewed by you and that someone will call him with results as soon as possible.

## 2016-04-07 ENCOUNTER — Telehealth: Payer: Self-pay

## 2016-04-07 DIAGNOSIS — R109 Unspecified abdominal pain: Secondary | ICD-10-CM

## 2016-04-07 NOTE — Telephone Encounter (Signed)
Ok done

## 2016-04-07 NOTE — Telephone Encounter (Addendum)
Attempted to schedule the patient's MRI Abdomen W Contrast.  The scheduler said the order needs to be changed for renal protocol.  It will need to be changed to an MRI Abdomen W and W/O Contrast.   Thank you.

## 2016-04-13 ENCOUNTER — Encounter: Payer: Self-pay | Admitting: Urgent Care

## 2016-04-15 ENCOUNTER — Other Ambulatory Visit: Payer: Self-pay | Admitting: Urgent Care

## 2016-04-17 ENCOUNTER — Other Ambulatory Visit: Payer: Self-pay | Admitting: Urgent Care

## 2016-04-17 DIAGNOSIS — N2889 Other specified disorders of kidney and ureter: Secondary | ICD-10-CM

## 2016-04-22 DIAGNOSIS — N289 Disorder of kidney and ureter, unspecified: Secondary | ICD-10-CM

## 2016-04-22 HISTORY — DX: Disorder of kidney and ureter, unspecified: N28.9

## 2016-04-23 ENCOUNTER — Ambulatory Visit
Admission: RE | Admit: 2016-04-23 | Discharge: 2016-04-23 | Disposition: A | Discharge status: Home / Self Care | Payer: No Typology Code available for payment source | Source: Ambulatory Visit | Attending: Urgent Care | Admitting: Urgent Care

## 2016-04-23 DIAGNOSIS — R109 Unspecified abdominal pain: Secondary | ICD-10-CM

## 2016-04-23 MED ORDER — GADOBENATE DIMEGLUMINE 529 MG/ML IV SOLN
10.0000 mL | Freq: Once | INTRAVENOUS | Status: AC | PRN
  Administered 2016-04-23: 10 mL via INTRAVENOUS

## 2016-04-24 ENCOUNTER — Ambulatory Visit (HOSPITAL_COMMUNITY)
Admission: RE | Admit: 2016-04-24 | Discharge: 2016-04-24 | Disposition: A | Discharge status: Home / Self Care | Payer: No Typology Code available for payment source | Source: Ambulatory Visit | Attending: Urology | Admitting: Urology

## 2016-04-24 ENCOUNTER — Other Ambulatory Visit (HOSPITAL_COMMUNITY): Payer: Self-pay | Admitting: Urology

## 2016-04-24 DIAGNOSIS — D4102 Neoplasm of uncertain behavior of left kidney: Secondary | ICD-10-CM

## 2016-04-24 DIAGNOSIS — D49512 Neoplasm of unspecified behavior of left kidney: Secondary | ICD-10-CM | POA: Insufficient documentation

## 2016-04-24 DIAGNOSIS — J986 Disorders of diaphragm: Secondary | ICD-10-CM | POA: Insufficient documentation

## 2016-04-27 ENCOUNTER — Other Ambulatory Visit: Payer: Self-pay | Admitting: Urology

## 2016-05-15 ENCOUNTER — Emergency Department (HOSPITAL_COMMUNITY): Payer: No Typology Code available for payment source

## 2016-05-15 ENCOUNTER — Encounter (HOSPITAL_COMMUNITY): Payer: Self-pay | Admitting: Emergency Medicine

## 2016-05-15 ENCOUNTER — Emergency Department (HOSPITAL_COMMUNITY)
Admission: EM | Admit: 2016-05-15 | Discharge: 2016-05-15 | Disposition: A | Discharge status: Home / Self Care | Payer: No Typology Code available for payment source | Attending: Emergency Medicine | Admitting: Emergency Medicine

## 2016-05-15 DIAGNOSIS — R519 Headache, unspecified: Secondary | ICD-10-CM

## 2016-05-15 DIAGNOSIS — H6692 Otitis media, unspecified, left ear: Secondary | ICD-10-CM | POA: Insufficient documentation

## 2016-05-15 DIAGNOSIS — Z79899 Other long term (current) drug therapy: Secondary | ICD-10-CM | POA: Insufficient documentation

## 2016-05-15 DIAGNOSIS — R200 Anesthesia of skin: Secondary | ICD-10-CM | POA: Insufficient documentation

## 2016-05-15 DIAGNOSIS — Z79891 Long term (current) use of opiate analgesic: Secondary | ICD-10-CM | POA: Insufficient documentation

## 2016-05-15 DIAGNOSIS — R51 Headache: Secondary | ICD-10-CM

## 2016-05-15 DIAGNOSIS — R5383 Other fatigue: Secondary | ICD-10-CM | POA: Insufficient documentation

## 2016-05-15 HISTORY — DX: Disorder of kidney and ureter, unspecified: N28.9

## 2016-05-15 MED ORDER — AMOXICILLIN-POT CLAVULANATE 875-125 MG PO TABS: 1.0000 | ORAL_TABLET | Freq: Two times a day (BID) | ORAL | 0 refills | Status: DC

## 2016-05-15 NOTE — ED Triage Notes (Signed)
Pt presents to the ED with complaints of headache. 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 Pt states the headache is more of a pressure on the top of his head which makes it feel "heavy". Pt reports he was recently diagnosed with renal cell carcinoma and is scheduled to have surgery on the 25th of this month to address that. Patient currently rates pain 2/10. Pt states that at times when his head is hurting his "vision begins to get somewhat blurry". Pt also states that since last Wednesday he has been feeling very weak.

## 2016-05-15 NOTE — ED Provider Notes (Signed)
[facility] DEPT Provider Note   CSN: [id number] Arrival date & time: 05/15/16  [id number]  By signing my name below, I, Rayna Sexton, attest that this documentation has been prepared under the direction and in the presence of Virgel Manifold, MD. Electronically Signed: Rayna Sexton, ED Scribe. 05/15/16. 12:57 PM.   History   Chief Complaint Chief Complaint  Patient presents with  . Headache    HPI HPI Comments: Jamie Rivers is a 35 y.o. male who presents to the Emergency Department complaining of constant, mild, HA onset many months ago that worsened beginning 2 weeks ago. 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 He states his HA is a throbbing sensation and radiates throughout his head. He reports associated "heavyness" in his head, intermittent blurry vision, nocturnal hyperhidrosis, general malaise, tingling in his scalp, thick/black left ear cerumen and recent weight loss. He states he currently weighs 108 lbs and weighed 135 lbs 1 year ago which was his normal weight. 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 Alinda Money at [facility]. He was experiencing flank pain prior to his dx and it was discovered through a CT abd but denies any other imaging was performed. No other associated symptoms at this time.    The history is provided by the patient. No language interpreter was used.    Past Medical History:  Diagnosis Date  . Anxiety   . Renal disorder 04/22/2016   Renal Cell Carcinoma    There are no active problems to display for this patient.   Past Surgical History:  Procedure Laterality Date  . CLEFT PALATE REPAIR       Home Medications    Prior to Admission medications   Medication Sig Start Date End Date Taking?  Authorizing Provider  Multiple Vitamins-Minerals (MULTIVITAL) tablet Take 1 tablet by mouth daily.   Yes Historical Provider, MD  naproxen sodium (ANAPROX) 220 MG tablet Take 220 mg by mouth daily.   Yes Historical Provider, MD  LORazepam (ATIVAN) 1 MG tablet Take 1 tablet (1 mg total) by mouth 2 (two) times daily. Patient not taking: Reported on 04/02/2016 12/01/13   Maude Leriche, PA-C  naproxen (NAPROSYN) 500 MG tablet Take 1 tablet (500 mg total) by mouth 2 (two) times daily. Patient not taking: Reported on 05/15/2016 11/05/15   Nona Dell, PA-C    Family History Family History  Problem Relation Age of Onset  . Hypertension Mother     Social History Social History  Substance Use Topics  . Smoking status: Never Smoker  . Smokeless tobacco: Never Used  . Alcohol use Yes     Allergies   Review of patient's allergies indicates no known allergies.   Review of Systems Review of Systems  Constitutional: Positive for diaphoresis, fatigue and unexpected weight change.  Eyes: Positive for visual disturbance.  Neurological: Positive for numbness and headaches.  All other systems reviewed and are negative.  Physical Exam Updated Vital Signs BP 108/80 (BP Location: Right Arm)   Pulse 62   Temp 98.3 F (36.8 C) (Oral)   Resp 16   Ht 5\' 7"  (1.702 m)   Wt 108 lb (49 kg)   SpO2 100%   BMI 16.92 kg/m   Physical Exam  Constitutional: He is oriented to  person, place, and time. He appears well-developed and well-nourished.  HENT:  Head: Normocephalic and atraumatic.  Left Ear: Tympanic membrane is erythematous.  Prior cleft repair. Left TM with evidence of prior scarring. Upper TM is erythematous. No effusion. Complains of pain with manipulation of the pinna.   Eyes: EOM are normal. Pupils are equal, round, and reactive to light.  Neck: Normal range of motion.  Cardiovascular: Normal rate, regular rhythm, normal heart sounds and intact distal pulses.   Pulmonary/Chest:  Effort normal and breath sounds normal. No respiratory distress.  Abdominal: Soft. He exhibits no distension. There is no tenderness.  Musculoskeletal: Normal range of motion.  Neurological: He is alert and oriented to person, place, and time.  5/5 strength in major muscle groups of  bilateral upper and lower extremities. Speech normal. No facial asymetry.   Skin: Skin is warm and dry.  Psychiatric: He has a normal mood and affect. Judgment normal.  Nursing note and vitals reviewed.  ED Treatments / Results  Labs (all labs ordered are listed, but only abnormal results are displayed) Labs Reviewed - No data to display  EKG  EKG Interpretation None       Radiology Ct Head Wo Contrast  Result Date: 05/15/2016 CLINICAL DATA:  Patient reports heaviness in his face and head for a couple of months. Recently found out he has cancer in his left kidney. Complains of headache. Patient reports removal of tumor over left eye when he was younger. Patient also states that he had a benign tumor removed from the left side of his head, behind the ear. EXAM: CT HEAD WITHOUT CONTRAST TECHNIQUE: Contiguous axial images were obtained from the base of the skull through the vertex without intravenous contrast. COMPARISON:  None. FINDINGS: Brain: There is no acute territorial infarction, intracranial hemorrhage, or extra-axial fluid collection. There is no focal mass, mass effect or midline shift. Ventricles are nonenlarged. There is a small calcification within the fourth ventricle. Vascular: No significant calcifications at the skullbase. No hyperdense artery identified. Skull: No acute fracture. There is fluid or soft tissue density within the left mastoid air cells with coalesce and Ayers cell present. There is a small bony defect within the left lateral mastoid bone, slight least posterior to the pinna. There is additional soft tissue density within the middle ear. No overlying soft tissue swelling.  Sinuses/Orbits: Mild mucosal thickening is present within the ethmoid sinuses. There are no acute fluid levels. Small metallic devices are present along the right anterior maxillary and anteromedial maxillary sinus walls. Other: Globes are grossly unremarkable. IMPRESSION: 1. No acute intracranial abnormality 2. Fluid and/or soft tissue density within the left mastoid air cells with dilated air cell present. Additional fluid or soft tissue density in the middle ear canal. Small bony defect in the left lateral mastoid bone posterior to the pinna. These findings could relate to postsurgical changes given clinical history, however clinical correlation is recommended. 3. Mild sinus disease Electronically Signed   By: Donavan Foil M.D.   On: 05/15/2016 13:52    Procedures Procedures  DIAGNOSTIC STUDIES: Oxygen Saturation is 100% on RA, normal by my interpretation.    COORDINATION OF CARE: 12:56 PM Discussed next steps with pt. Pt verbalized understanding and is agreeable with the plan.    Medications Ordered in ED Medications - No data to display  Initial Impression / Assessment and Plan / ED Course  I have reviewed the triage vital signs and the nursing notes.  Pertinent labs & imaging  results that were available during my care of the patient were reviewed by me and considered in my medical decision making (see chart for details).  Clinical Course   35yM with HA. Imaging as above. Will place on abx. Needs to FU with ENT, particularly with his past hx. It has been determined that no acute conditions requiring further emergency intervention are present at this time. The patient has been advised of the diagnosis and plan. I reviewed any labs and imaging including any potential incidental findings. We have discussed signs and symptoms that warrant return to the ED and they are listed in the discharge instructions.   Final Clinical Impressions(s) / ED Diagnoses   Final diagnoses:  Nonintractable  headache, unspecified chronicity pattern, unspecified headache type  Left otitis media, recurrence not specified, unspecified chronicity, unspecified otitis media type   I personally preformed the services scribed in my presence. The recorded information has been reviewed is accurate. Virgel Manifold, MD.  New Prescriptions New Prescriptions   No medications on file     Virgel Manifold, MD 05/20/16 1428

## 2016-05-26 NOTE — Patient Instructions (Signed)
Jamie Rivers  05/26/2016   Your procedure is scheduled on: 06/01/2016    Report to [facility] Main  Entrance take [place]  elevators to 3rd floor to  [place] at    [place] AM.  Call this number if you have problems the morning of surgery [phone]   Remember: ONLY 1 PERSON MAY GO WITH YOU TO SHORT STAY TO GET  READY MORNING OF [place].  Do not eat food or drink liquids :After Midnight.     Take these medicines the morning of surgery with A SIP OF WATER: none                                 You may not have any metal on your body including hair pins and              piercings  Do not wear jewelry,  lotions, powders or perfumes, deodorant                        Men may shave face and neck.   Do not bring valuables to the hospital. [place].  Contacts, dentures or bridgework may not be worn into surgery.  Leave suitcase in the car. After surgery it may be brought to your room.         Special Instructions: coughing and deep breathing exercises, leg exercises              Please read over the following fact sheets you were given: _____________________________________________________________________             [facility] - Preparing for Surgery Before surgery, you can play an important role.  Because skin is not sterile, your skin needs to be as free of germs as possible.  You can reduce the number of germs on your skin by washing with CHG (chlorahexidine gluconate) soap before surgery.  CHG is an antiseptic cleaner which kills germs and bonds with the skin to continue killing germs even after washing. Please DO NOT use if you have an allergy to CHG or antibacterial soaps.  If your skin becomes reddened/irritated stop using the CHG and inform your nurse when you arrive at Short Stay. Do not shave (including legs and underarms) for at least 48 hours prior to the first CHG shower.  You may shave your  face/neck. Please follow these instructions carefully:  1.  Shower with CHG Soap the night before surgery and the  morning of Surgery.  2.  If you choose to wash your hair, wash your hair first as usual with your  normal  shampoo.  3.  After you shampoo, rinse your hair and body thoroughly to remove the  shampoo.                           4.  Use CHG as you would any other liquid soap.  You can apply chg directly  to the skin and wash                       Gently with a scrungie or clean washcloth.  5.  Apply the CHG  Soap to your body ONLY FROM THE NECK DOWN.   Do not use on face/ open                           Wound or open sores. Avoid contact with eyes, ears mouth and genitals (private parts).                       Wash face,  Genitals (private parts) with your normal soap.             6.  Wash thoroughly, paying special attention to the area where your surgery  will be performed.  7.  Thoroughly rinse your body with warm water from the neck down.  8.  DO NOT shower/wash with your normal soap after using and rinsing off  the CHG Soap.                9.  Pat yourself dry with a clean towel.            10.  Wear clean pajamas.            11.  Place clean sheets on your bed the night of your first shower and do not  sleep with pets. Day of Surgery : Do not apply any lotions/deodorants the morning of surgery.  Please wear clean clothes to the hospital/surgery center.  FAILURE TO FOLLOW THESE INSTRUCTIONS MAY RESULT IN THE CANCELLATION OF YOUR SURGERY PATIENT SIGNATURE_________________________________  NURSE SIGNATURE__________________________________  ________________________________________________________________________  WHAT IS A BLOOD TRANSFUSION? Blood Transfusion Information  A transfusion is the replacement of blood or some of its parts. Blood is made up of multiple cells which provide different functions.  Red blood cells carry oxygen and are used for blood loss  replacement.  White blood cells fight against infection.  Platelets control bleeding.  Plasma helps clot blood.  Other blood products are available for specialized needs, such as hemophilia or other clotting disorders. BEFORE THE TRANSFUSION  Who gives blood for transfusions?   Healthy volunteers who are fully evaluated to make sure their blood is safe. This is blood bank blood. Transfusion therapy is the safest it has ever been in the practice of medicine. Before blood is taken from a donor, a complete history is taken to make sure that person has no history of diseases nor engages in risky social behavior (examples are intravenous drug use or sexual activity with multiple partners). The donor's travel history is screened to minimize risk of transmitting infections, such as malaria. The donated blood is tested for signs of infectious diseases, such as HIV and hepatitis. The blood is then tested to be sure it is compatible with you in order to minimize the chance of a transfusion reaction. If you or a relative donates blood, this is often done in anticipation of surgery and is not appropriate for emergency situations. It takes many days to process the donated blood. RISKS AND COMPLICATIONS Although transfusion therapy is very safe and saves many lives, the main dangers of transfusion include:   Getting an infectious disease.  Developing a transfusion reaction. This is an allergic reaction to something in the blood you were given. Every precaution is taken to prevent this. The decision to have a blood transfusion has been considered carefully by your caregiver before blood is given. Blood is not given unless the benefits outweigh the risks. AFTER THE TRANSFUSION  Right after receiving a blood transfusion, you  will usually feel much better and more energetic. This is especially true if your red blood cells have gotten low (anemic). The transfusion raises the level of the red blood cells which  carry oxygen, and this usually causes an energy increase.  The nurse administering the transfusion will monitor you carefully for complications. HOME CARE INSTRUCTIONS  No special instructions are needed after a transfusion. You may find your energy is better. Speak with your caregiver about any limitations on activity for underlying diseases you may have. SEEK MEDICAL CARE IF:   Your condition is not improving after your transfusion.  You develop redness or irritation at the intravenous (IV) site. SEEK IMMEDIATE MEDICAL CARE IF:  Any of the following symptoms occur over the next 12 hours:  Shaking chills.  You have a temperature by mouth above 102 F (38.9 C), not controlled by medicine.  Chest, back, or muscle pain.  People around you feel you are not acting correctly or are confused.  Shortness of breath or difficulty breathing.  Dizziness and fainting.  You get a rash or develop hives.  You have a decrease in urine output.  Your urine turns a dark color or changes to pink, red, or brown. Any of the following symptoms occur over the next 10 days:  You have a temperature by mouth above 102 F (38.9 C), not controlled by medicine.  Shortness of breath.  Weakness after normal activity.  The white part of the eye turns yellow (jaundice).  You have a decrease in the amount of urine or are urinating less often.  Your urine turns a dark color or changes to pink, red, or brown. Document Released: 08/21/2000 Document Revised: 11/16/2011 Document Reviewed: 04/09/2008 ExitCare Patient Information 2014 [place].  _______________________________________________________________________  Incentive Spirometer  An incentive spirometer is a tool that can help keep your lungs clear and active. This tool measures how well you are filling your lungs with each breath. Taking long deep breaths may help reverse or decrease the chance of developing breathing (pulmonary) problems  (especially infection) following:  A long period of time when you are unable to move or be active. BEFORE THE PROCEDURE   If the spirometer includes an indicator to show your best effort, your nurse or respiratory therapist will set it to a desired goal.  If possible, sit up straight or lean slightly forward. Try not to slouch.  Hold the incentive spirometer in an upright position. INSTRUCTIONS FOR USE  1. Sit on the edge of your bed if possible, or sit up as far as you can in bed or on a chair. 2. Hold the incentive spirometer in an upright position. 3. Breathe out normally. 4. Place the mouthpiece in your mouth and seal your lips tightly around it. 5. Breathe in slowly and as deeply as possible, raising the piston or the ball toward the top of the column. 6. Hold your breath for 3-5 seconds or for as long as possible. Allow the piston or ball to fall to the bottom of the column. 7. Remove the mouthpiece from your mouth and breathe out normally. 8. Rest for a few seconds and repeat Steps 1 through 7 at least 10 times every 1-2 hours when you are awake. Take your time and take a few normal breaths between deep breaths. 9. The spirometer may include an indicator to show your best effort. Use the indicator as a goal to work toward during each repetition. 10. After each set of 10 deep breaths, practice  coughing to be sure your lungs are clear. If you have an incision (the cut made at the time of surgery), support your incision when coughing by placing a pillow or rolled up towels firmly against it. Once you are able to get out of bed, walk around indoors and cough well. You may stop using the incentive spirometer when instructed by your caregiver.  RISKS AND COMPLICATIONS  Take your time so you do not get dizzy or light-headed.  If you are in pain, you may need to take or ask for pain medication before doing incentive spirometry. It is harder to take a deep breath if you are having  pain. AFTER USE  Rest and breathe slowly and easily.  It can be helpful to keep track of a log of your progress. Your caregiver can provide you with a simple table to help with this. If you are using the spirometer at home, follow these instructions: [place] IF:   You are having difficultly using the spirometer.  You have trouble using the spirometer as often as instructed.  Your pain medication is not giving enough relief while using the spirometer.  You develop fever of 100.5 F (38.1 C) or higher. SEEK IMMEDIATE MEDICAL CARE IF:   You cough up bloody sputum that had not been present before.  You develop fever of 102 F (38.9 C) or greater.  You develop worsening pain at or near the incision site. MAKE SURE YOU:   Understand these instructions.  Will watch your condition.  Will get help right away if you are not doing well or get worse. Document Released: 01/04/2007 Document Revised: 11/16/2011 Document Reviewed: 03/07/2007 [facility] Patient Information 2014 [place], [place].   ________________________________________________________________________

## 2016-05-27 ENCOUNTER — Encounter (HOSPITAL_COMMUNITY): Payer: Self-pay

## 2016-05-27 ENCOUNTER — Encounter (HOSPITAL_COMMUNITY)
Admission: RE | Admit: 2016-05-27 | Discharge: 2016-05-27 | Disposition: A | Discharge status: Home / Self Care | Payer: No Typology Code available for payment source | Source: Ambulatory Visit | Attending: Urology | Admitting: Urology

## 2016-05-27 DIAGNOSIS — Z01818 Encounter for other preprocedural examination: Secondary | ICD-10-CM | POA: Insufficient documentation

## 2016-05-27 LAB — BASIC METABOLIC PANEL
ANION GAP: 7 (ref 5–15)
BUN: 20 mg/dL (ref 6–20)
CALCIUM: 9.5 mg/dL (ref 8.9–10.3)
CO2: 28 mmol/L (ref 22–32)
Chloride: 101 mmol/L (ref 101–111)
Creatinine, Ser: 1.01 mg/dL (ref 0.61–1.24)
GLUCOSE: 87 mg/dL (ref 65–99)
Potassium: 3.7 mmol/L (ref 3.5–5.1)
SODIUM: 136 mmol/L (ref 135–145)

## 2016-05-27 LAB — CBC
HCT: 38.9 % — ABNORMAL LOW (ref 39.0–52.0)
HEMOGLOBIN: 13.2 g/dL (ref 13.0–17.0)
MCH: 29.1 pg (ref 26.0–34.0)
MCHC: 33.9 g/dL (ref 30.0–36.0)
MCV: 85.9 fL (ref 78.0–100.0)
Platelets: 341 10*3/uL (ref 150–400)
RBC: 4.53 MIL/uL (ref 4.22–5.81)
RDW: 13.9 % (ref 11.5–15.5)
WBC: 5 10*3/uL (ref 4.0–10.5)

## 2016-05-27 LAB — ABO/RH: ABO/RH(D): A POS

## 2016-05-27 NOTE — Progress Notes (Signed)
04/24/16- 2V CXR- EPIC

## 2016-05-28 NOTE — Progress Notes (Signed)
Final EKG done 05/27/16-EPIC.

## 2016-05-29 NOTE — H&P (Signed)
CC/HPI: Left renal neoplasm    Jamie Rivers is a pleasant 35 year old gentleman seen today at the request of Jaynee Eagles, PA-C for an incidentally detected left renal neoplasm. He has had nonspecific left upper quadrant pain that began in February and has been persistent since then. He also has noted increasing fatigue and weight loss having dropped from 135 pounds to 108 pounds since February. His weight loss has plateaued over the past few months. He has not noted any hematuria. He has no family history of kidney cancer or end-stage renal disease. He is otherwise quite healthy with no major medical comorbidities.   He underwent a CT scan for further evaluation of his pain symptoms. This was a CT scan of the abdomen and pelvis with contrast on 04/03/16. This demonstrated a 3 x 2.5 cm heterogeneous partially cystic mass in the upper pole of the left kidney. This prompted a subsequent MRI confirmed an enhancing partially cystic renal mass concerning for renal cell carcinoma. No regional lymphadenopathy was noted. No contralateral renal masses were noted. No adrenal gland masses were noted. No other evidence of metastatic disease to the abdomen was noted. Liver function tests were normal. Hemoglobin was 13.1. Serum creatinine was 1.08.     ALLERGIES: None   MEDICATIONS: Multi-Day Vitamins  Naproxen Sodium 220 mg capsule     GU PSH: None     PSH Notes: Clef platate, bone graph, left ear-tumor removed   NON-GU PSH: Anesth, Cleft Palate Repair    GU PMH: None   NON-GU PMH: Generalized anxiety disorder    FAMILY HISTORY: Hypertension - Mother   SOCIAL HISTORY: Marital Status: Single Current Smoking Status: Patient has never smoked.  Does drink.  Drinks 3 caffeinated drinks per day. Patient's occupation Equities trader.    REVIEW OF SYSTEMS:    GU Review Male:   Patient reports frequent urination, hard to postpone urination, get up at night to urinate, and stream starts and stops.  Patient denies burning/ pain with urination, leakage of urine, trouble starting your streams, and have to strain to urinate .  Gastrointestinal (Upper):   Patient denies nausea and vomiting.  Gastrointestinal (Lower):   Patient denies diarrhea and constipation.  Constitutional:   Patient reports night sweats, weight loss, and fatigue. Patient denies fever.  Skin:   Patient denies skin rash/ lesion and itching.  Eyes:   Patient denies blurred vision and double vision.  Ears/ Nose/ Throat:   Patient denies sore throat and sinus problems.  Hematologic/Lymphatic:   Patient denies swollen glands and easy bruising.  Cardiovascular:   Patient denies leg swelling and chest pains.  Respiratory:   Patient denies cough and shortness of breath.  Endocrine:   Patient denies excessive thirst.  Musculoskeletal:   Patient denies back pain and joint pain.  Neurological:   Patient denies dizziness and headaches.  Psychologic:   Patient denies depression and anxiety.   VITAL SIGNS:      04/24/2016 10:09 AM  Weight 108 lb / 48.99 kg  BP 123/84 mmHg  Pulse 69 /min  Temperature 97.5 F / 36 C   MULTI-SYSTEM PHYSICAL EXAMINATION:    Constitutional: Well-nourished. No physical deformities. Normally developed. Good grooming.  Neck: Neck symmetrical, not swollen. Normal tracheal position.  Respiratory: No labored breathing, no use of accessory muscles.   Cardiovascular: Normal temperature, normal extremity pulses, no swelling, no varicosities.  Lymphatic: No enlargement of neck, axillae, groin.  Skin: No paleness, no jaundice, no cyanosis. No lesion, no ulcer, no rash.  Neurologic / Psychiatric: Oriented to time, oriented to place, oriented to person. No depression, no anxiety, no agitation.  Gastrointestinal: No mass, no tenderness, no rigidity, non obese abdomen. 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  Eyes: Normal conjunctivae. Normal  eyelids.  Ears, Nose, Mouth, and Throat: Left ear no scars, no lesions, no masses. Right ear no scars, no lesions, no masses. Nose no scars, no lesions, no masses. Normal hearing. Normal lips.  Musculoskeletal: Normal gait and station of head and neck.     PAST DATA REVIEWED:  Source Of History:  Patient  Lab Test Review:   CBC, CMP  Urine Test Review:   Urinalysis  X-Ray Review: C.T. Abdomen: Reviewed Films.  MRI Abdomen: Reviewed Films.    Notes:                     I independently reviewed his CT and MRI. Findings are as dictated above.   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          Schedule Return Visit: Other See Visit Notes             Note: Will call to schedule surgery          Document Letter(s):  Created for Patient: Clinical Summary         Notes:   1. Right renal neoplasm: His mass is concerning for malignancy.   The patient was provided information regarding their renal mass including the relative risk of benign versus malignant pathology and the natural history of renal cell carcinoma and other possible malignancies of the kidney. The role of renal biopsy, laboratory testing, and imaging studies to further characterize renal masses and/or the presence of metastatic disease were explained. We discussed the role of active surveillance, surgical therapy with both radical nephrectomy and nephron-sparing surgery, and ablative therapy in the treatment of renal masses. In addition, we discussed our goals of providing an accurate diagnosis and oncologic control while  maintaining optimal renal function as appropriate based on the size, location, and complexity of their renal mass as well as their co-morbidities.   We have discussed the risks of treatment in detail including but not limited to bleeding, infection, heart attack, stroke, death, venothromoboembolism, cancer recurrence, injury/damage to surrounding organs and structures, urine leak, the possibility of open surgical conversion for patients undergoing minimally invasive surgery, the risk of developing chronic kidney disease and its associated implications, and the potential risk of end stage renal disease possibly necessitating dialysis.   He will have a chest x-ray performed to complete his metastatic evaluation. I recommended proceeding with a left robot-assisted laparoscopic partial nephrectomy. This will be scheduled for the near future.   Cc: Jaynee Eagles, PA-C

## 2016-06-01 ENCOUNTER — Inpatient Hospital Stay (HOSPITAL_COMMUNITY): Payer: Self-pay | Admitting: Certified Registered Nurse Anesthetist

## 2016-06-01 ENCOUNTER — Encounter (HOSPITAL_COMMUNITY): Payer: Self-pay | Admitting: *Deleted

## 2016-06-01 ENCOUNTER — Inpatient Hospital Stay (HOSPITAL_COMMUNITY)
Admission: RE | Admit: 2016-06-01 | Discharge: 2016-06-03 | DRG: 658 | Disposition: A | Discharge status: Home / Self Care | Payer: Self-pay | Source: Ambulatory Visit | Attending: Urology | Admitting: Urology

## 2016-06-01 ENCOUNTER — Encounter (HOSPITAL_COMMUNITY)
Admission: RE | Disposition: A | Discharge status: Home / Self Care | Payer: Self-pay | Source: Ambulatory Visit | Attending: Urology

## 2016-06-01 DIAGNOSIS — Z87730 Personal history of (corrected) cleft lip and palate: Secondary | ICD-10-CM

## 2016-06-01 DIAGNOSIS — C642 Malignant neoplasm of left kidney, except renal pelvis: Principal | ICD-10-CM | POA: Diagnosis present

## 2016-06-01 DIAGNOSIS — D49512 Neoplasm of unspecified behavior of left kidney: Secondary | ICD-10-CM | POA: Diagnosis present

## 2016-06-01 DIAGNOSIS — Z8249 Family history of ischemic heart disease and other diseases of the circulatory system: Secondary | ICD-10-CM

## 2016-06-01 HISTORY — PX: ROBOTIC ASSITED PARTIAL NEPHRECTOMY: SHX6087

## 2016-06-01 LAB — HEMOGLOBIN AND HEMATOCRIT, BLOOD
HCT: 34.8 % — ABNORMAL LOW (ref 39.0–52.0)
HEMOGLOBIN: 12 g/dL — AB (ref 13.0–17.0)

## 2016-06-01 LAB — TYPE AND SCREEN
ABO/RH(D): A POS
ANTIBODY SCREEN: NEGATIVE

## 2016-06-01 LAB — BASIC METABOLIC PANEL
Anion gap: 7 (ref 5–15)
BUN: 13 mg/dL (ref 6–20)
CHLORIDE: 104 mmol/L (ref 101–111)
CO2: 26 mmol/L (ref 22–32)
Calcium: 8.8 mg/dL — ABNORMAL LOW (ref 8.9–10.3)
Creatinine, Ser: 1.06 mg/dL (ref 0.61–1.24)
GFR calc Af Amer: >60 mL/min (ref >60)
GFR calc non Af Amer: >60 mL/min (ref >60)
Glucose, Bld: 163 mg/dL — ABNORMAL HIGH (ref 65–99)
POTASSIUM: 3.7 mmol/L (ref 3.5–5.1)
SODIUM: 137 mmol/L (ref 135–145)

## 2016-06-01 SURGERY — NEPHRECTOMY, PARTIAL, ROBOT-ASSISTED
Anesthesia: General | Laterality: Left

## 2016-06-01 MED ORDER — MANNITOL 25 % IV SOLN
25.0000 g | INTRAVENOUS | Status: AC
  Filled 2016-06-01: qty 100

## 2016-06-01 MED ORDER — LIDOCAINE 2% (20 MG/ML) 5 ML SYRINGE
INTRAMUSCULAR | Status: AC
  Filled 2016-06-01: qty 5

## 2016-06-01 MED ORDER — MIDAZOLAM HCL 2 MG/2ML IJ SOLN
INTRAMUSCULAR | Status: AC
  Filled 2016-06-01: qty 2

## 2016-06-01 MED ORDER — ONDANSETRON HCL 4 MG/2ML IJ SOLN: 4.0000 mg | INTRAMUSCULAR | Status: DC | PRN

## 2016-06-01 MED ORDER — LIDOCAINE 2% (20 MG/ML) 5 ML SYRINGE
INTRAMUSCULAR | Status: DC | PRN
  Administered 2016-06-01: 50 mg via INTRAVENOUS

## 2016-06-01 MED ORDER — FENTANYL CITRATE (PF) 100 MCG/2ML IJ SOLN
INTRAMUSCULAR | Status: DC | PRN
  Administered 2016-06-01 (×3): 50 ug via INTRAVENOUS

## 2016-06-01 MED ORDER — DOCUSATE SODIUM 100 MG PO CAPS
100.0000 mg | ORAL_CAPSULE | Freq: Two times a day (BID) | ORAL | Status: DC
  Administered 2016-06-01 – 2016-06-03 (×4): 100 mg via ORAL
  Filled 2016-06-01 (×4): qty 1

## 2016-06-01 MED ORDER — CEFAZOLIN SODIUM-DEXTROSE 2-4 GM/100ML-% IV SOLN
INTRAVENOUS | Status: AC
  Filled 2016-06-01: qty 100

## 2016-06-01 MED ORDER — FENTANYL CITRATE (PF) 100 MCG/2ML IJ SOLN
INTRAMUSCULAR | Status: AC
  Filled 2016-06-01: qty 2

## 2016-06-01 MED ORDER — CEFAZOLIN SODIUM-DEXTROSE 2-4 GM/100ML-% IV SOLN
2.0000 g | INTRAVENOUS | Status: AC
  Administered 2016-06-01: 2 g via INTRAVENOUS

## 2016-06-01 MED ORDER — STERILE WATER FOR IRRIGATION IR SOLN
Status: DC | PRN
  Administered 2016-06-01: 2000 mL

## 2016-06-01 MED ORDER — LACTATED RINGERS IV SOLN
INTRAVENOUS | Status: DC | PRN
  Administered 2016-06-01 (×2): via INTRAVENOUS

## 2016-06-01 MED ORDER — HYDROCODONE-ACETAMINOPHEN 5-325 MG PO TABS: 1.0000 | ORAL_TABLET | Freq: Four times a day (QID) | ORAL | 0 refills | Status: DC | PRN

## 2016-06-01 MED ORDER — LACTATED RINGERS IR SOLN
Status: DC | PRN
  Administered 2016-06-01: 1000 mL

## 2016-06-01 MED ORDER — ROCURONIUM BROMIDE 10 MG/ML (PF) SYRINGE
PREFILLED_SYRINGE | INTRAVENOUS | Status: DC | PRN
  Administered 2016-06-01: 40 mg via INTRAVENOUS
  Administered 2016-06-01 (×2): 10 mg via INTRAVENOUS

## 2016-06-01 MED ORDER — BUPIVACAINE LIPOSOME 1.3 % IJ SUSP
INTRAMUSCULAR | Status: DC | PRN
  Administered 2016-06-01: 20 mL

## 2016-06-01 MED ORDER — MIDAZOLAM HCL 5 MG/5ML IJ SOLN
INTRAMUSCULAR | Status: DC | PRN
  Administered 2016-06-01: 2 mg via INTRAVENOUS

## 2016-06-01 MED ORDER — DIPHENHYDRAMINE HCL 12.5 MG/5ML PO ELIX: 12.5000 mg | ORAL_SOLUTION | Freq: Four times a day (QID) | ORAL | Status: DC | PRN

## 2016-06-01 MED ORDER — HYDROMORPHONE HCL 1 MG/ML IJ SOLN
0.2500 mg | INTRAMUSCULAR | Status: DC | PRN
Start: 2016-06-01 — End: 2016-06-01
  Administered 2016-06-01 (×2): 0.25 mg via INTRAVENOUS
  Administered 2016-06-01: 0.5 mg via INTRAVENOUS

## 2016-06-01 MED ORDER — MEPERIDINE HCL 25 MG/ML IJ SOLN
INTRAMUSCULAR | Status: AC
  Filled 2016-06-01: qty 1

## 2016-06-01 MED ORDER — PROMETHAZINE HCL 25 MG/ML IJ SOLN: 6.2500 mg | INTRAMUSCULAR | Status: DC | PRN

## 2016-06-01 MED ORDER — MORPHINE SULFATE (PF) 2 MG/ML IV SOLN
2.0000 mg | INTRAVENOUS | Status: DC | PRN
  Administered 2016-06-01: 2 mg via INTRAVENOUS
  Filled 2016-06-01: qty 1

## 2016-06-01 MED ORDER — BUPIVACAINE LIPOSOME 1.3 % IJ SUSP
INTRAMUSCULAR | Status: AC
  Filled 2016-06-01: qty 20

## 2016-06-01 MED ORDER — ROCURONIUM BROMIDE 10 MG/ML (PF) SYRINGE
PREFILLED_SYRINGE | INTRAVENOUS | Status: AC
  Filled 2016-06-01: qty 10

## 2016-06-01 MED ORDER — MANNITOL 25 % IV SOLN
INTRAVENOUS | Status: AC
  Filled 2016-06-01: qty 100

## 2016-06-01 MED ORDER — HYDROMORPHONE HCL 1 MG/ML IJ SOLN
INTRAMUSCULAR | Status: AC
  Filled 2016-06-01: qty 1

## 2016-06-01 MED ORDER — ACETAMINOPHEN 10 MG/ML IV SOLN
1000.0000 mg | Freq: Four times a day (QID) | INTRAVENOUS | Status: DC
  Administered 2016-06-01 – 2016-06-02 (×3): 1000 mg via INTRAVENOUS
  Filled 2016-06-01 (×5): qty 100

## 2016-06-01 MED ORDER — DIPHENHYDRAMINE HCL 50 MG/ML IJ SOLN
12.5000 mg | Freq: Four times a day (QID) | INTRAMUSCULAR | Status: DC | PRN
Start: 2016-06-01 — End: 2016-06-03

## 2016-06-01 MED ORDER — MEPERIDINE HCL 50 MG/ML IJ SOLN
6.2500 mg | INTRAMUSCULAR | Status: DC | PRN
Start: 2016-06-01 — End: 2016-06-01
  Administered 2016-06-01: 12.5 mg via INTRAVENOUS

## 2016-06-01 MED ORDER — BOOST / RESOURCE BREEZE PO LIQD
1.0000 | Freq: Three times a day (TID) | ORAL | Status: DC
Start: 2016-06-01 — End: 2016-06-02
  Administered 2016-06-01 – 2016-06-02 (×2): 1 via ORAL

## 2016-06-01 MED ORDER — PHENYLEPHRINE 40 MCG/ML (10ML) SYRINGE FOR IV PUSH (FOR BLOOD PRESSURE SUPPORT)
PREFILLED_SYRINGE | INTRAVENOUS | Status: DC | PRN
  Administered 2016-06-01: 80 ug via INTRAVENOUS
  Administered 2016-06-01: 160 ug via INTRAVENOUS
  Administered 2016-06-01: 40 ug via INTRAVENOUS
  Administered 2016-06-01 (×2): 100 ug via INTRAVENOUS
  Administered 2016-06-01: 80 ug via INTRAVENOUS

## 2016-06-01 MED ORDER — PROPOFOL 10 MG/ML IV BOLUS
INTRAVENOUS | Status: AC
  Filled 2016-06-01: qty 20

## 2016-06-01 MED ORDER — SODIUM CHLORIDE 0.9 % IJ SOLN
INTRAMUSCULAR | Status: DC | PRN
  Administered 2016-06-01: 20 mL

## 2016-06-01 MED ORDER — SUGAMMADEX SODIUM 200 MG/2ML IV SOLN
INTRAVENOUS | Status: DC | PRN
  Administered 2016-06-01: 100 mg via INTRAVENOUS

## 2016-06-01 MED ORDER — PROPOFOL 10 MG/ML IV BOLUS
INTRAVENOUS | Status: DC | PRN
  Administered 2016-06-01: 100 mg via INTRAVENOUS

## 2016-06-01 MED ORDER — CEFAZOLIN IN D5W 1 GM/50ML IV SOLN
1.0000 g | Freq: Three times a day (TID) | INTRAVENOUS | Status: AC
  Administered 2016-06-01 – 2016-06-02 (×2): 1 g via INTRAVENOUS
  Filled 2016-06-01 (×2): qty 50

## 2016-06-01 MED ORDER — DEXTROSE-NACL 5-0.45 % IV SOLN
INTRAVENOUS | Status: DC
  Administered 2016-06-01: 150 mL/h via INTRAVENOUS
  Administered 2016-06-01 – 2016-06-02 (×3): via INTRAVENOUS

## 2016-06-01 MED ORDER — SODIUM CHLORIDE 0.9 % IJ SOLN
INTRAMUSCULAR | Status: AC
  Filled 2016-06-01: qty 20

## 2016-06-01 SURGICAL SUPPLY — 55 items
APPLICATOR SURGIFLO ENDO (HEMOSTASIS) ×3 IMPLANT
CHLORAPREP W/TINT 26ML (MISCELLANEOUS) ×3 IMPLANT
CLIP LIGATING HEM O LOK PURPLE (MISCELLANEOUS) ×3 IMPLANT
CLIP LIGATING HEMO O LOK GREEN (MISCELLANEOUS) ×6 IMPLANT
COVER SURGICAL LIGHT HANDLE (MISCELLANEOUS) ×3 IMPLANT
COVER TIP SHEARS 8 DVNC (MISCELLANEOUS) ×1 IMPLANT
COVER TIP SHEARS 8MM DA VINCI (MISCELLANEOUS) ×2
DECANTER SPIKE VIAL GLASS SM (MISCELLANEOUS) ×3 IMPLANT
DRAIN CHANNEL 15F RND FF 3/16 (WOUND CARE) ×3 IMPLANT
DRAPE ARM DVNC X/XI (DISPOSABLE) ×4 IMPLANT
DRAPE COLUMN DVNC XI (DISPOSABLE) ×1 IMPLANT
DRAPE DA VINCI XI ARM (DISPOSABLE) ×8
DRAPE DA VINCI XI COLUMN (DISPOSABLE) ×2
DRAPE INCISE IOBAN 66X45 STRL (DRAPES) ×3 IMPLANT
DRAPE SHEET LG 3/4 BI-LAMINATE (DRAPES) ×3 IMPLANT
DRSG TEGADERM 4X4.75 (GAUZE/BANDAGES/DRESSINGS) ×3 IMPLANT
ELECT PENCIL ROCKER SW 15FT (MISCELLANEOUS) ×3 IMPLANT
ELECT REM PT RETURN 9FT ADLT (ELECTROSURGICAL) ×3
ELECTRODE REM PT RTRN 9FT ADLT (ELECTROSURGICAL) ×1 IMPLANT
EVACUATOR SILICONE 100CC (DRAIN) ×3 IMPLANT
GAUZE SPONGE 2X2 8PLY STRL LF (GAUZE/BANDAGES/DRESSINGS) ×1 IMPLANT
GLOVE BIO SURGEON STRL SZ 6.5 (GLOVE) ×2 IMPLANT
GLOVE BIO SURGEONS STRL SZ 6.5 (GLOVE) ×1
GLOVE BIOGEL M STRL SZ7.5 (GLOVE) ×6 IMPLANT
GOWN STRL REUS W/TWL LRG LVL3 (GOWN DISPOSABLE) ×15 IMPLANT
HEMOSTAT SURGICEL 4X8 (HEMOSTASIS) IMPLANT
IRRIG SUCT STRYKERFLOW 2 WTIP (MISCELLANEOUS) ×3
IRRIGATION SUCT STRKRFLW 2 WTP (MISCELLANEOUS) ×1 IMPLANT
KIT BASIN OR (CUSTOM PROCEDURE TRAY) ×3 IMPLANT
LIQUID BAND (GAUZE/BANDAGES/DRESSINGS) ×3 IMPLANT
MANIFOLD NEPTUNE II (INSTRUMENTS) ×3 IMPLANT
POSITIONER SURGICAL ARM (MISCELLANEOUS) ×6 IMPLANT
POUCH SPECIMEN RETRIEVAL 10MM (ENDOMECHANICALS) ×3 IMPLANT
SEAL CANN UNIV 5-8 DVNC XI (MISCELLANEOUS) ×4 IMPLANT
SEAL XI 5MM-8MM UNIVERSAL (MISCELLANEOUS) ×8
SOLUTION ELECTROLUBE (MISCELLANEOUS) ×3 IMPLANT
SPONGE GAUZE 2X2 STER 10/PKG (GAUZE/BANDAGES/DRESSINGS) ×2
SURGIFLO W/THROMBIN 8M KIT (HEMOSTASIS) ×3 IMPLANT
SUT ETHILON 3 0 PS 1 (SUTURE) ×3 IMPLANT
SUT MNCRL AB 4-0 PS2 18 (SUTURE) ×6 IMPLANT
SUT V-LOC BARB 180 2/0GR6 GS22 (SUTURE) ×3
SUT VIC AB 0 CT1 27 (SUTURE) ×2
SUT VIC AB 0 CT1 27XBRD ANTBC (SUTURE) ×1 IMPLANT
SUT VICRYL 0 UR6 27IN ABS (SUTURE) ×6 IMPLANT
SUT VLOC BARB 180 ABS3/0GR12 (SUTURE) ×3
SUTURE V-LC BRB 180 2/0GR6GS22 (SUTURE) ×1 IMPLANT
SUTURE VLOC BRB 180 ABS3/0GR12 (SUTURE) ×1 IMPLANT
TAPE STRIPS DRAPE STRL (GAUZE/BANDAGES/DRESSINGS) IMPLANT
TOWEL OR 17X26 10 PK STRL BLUE (TOWEL DISPOSABLE) ×3 IMPLANT
TRAY FOLEY W/METER SILVER 16FR (SET/KITS/TRAYS/PACK) ×3 IMPLANT
TRAY LAPAROSCOPIC (CUSTOM PROCEDURE TRAY) ×3 IMPLANT
TROCAR BLADELESS OPT 5 100 (ENDOMECHANICALS) ×3 IMPLANT
TROCAR XCEL 12X100 BLDLESS (ENDOMECHANICALS) ×3 IMPLANT
TUBING INSUFFLATION 10FT LAP (TUBING) IMPLANT
WATER STERILE IRR 1500ML POUR (IV SOLUTION) ×6 IMPLANT

## 2016-06-01 NOTE — Interval H&P Note (Signed)
History and Physical Interval Note:  06/01/2016 10:49 AM  Jamie Rivers  has presented today for surgery, with the diagnosis of LEFT RENAL MASS  The various methods of treatment have been discussed with the patient and family. After consideration of risks, benefits and other options for treatment, the patient has consented to  Procedure(s): XI ROBOTIC ASSITED PARTIAL NEPHRECTOMY (Left) as a surgical intervention .  The patient's history has been reviewed, patient examined, no change in status, stable for surgery.  I have reviewed the patient's chart and labs.  Questions were answered to the patient's satisfaction.     Astraea Gaughran,LES

## 2016-06-01 NOTE — Discharge Instructions (Signed)

## 2016-06-01 NOTE — Plan of Care (Signed)
Problem: Activity: Goal: Ability to return to normal activity level will improve Outcome: Progressing Pt on bedrest tonight.

## 2016-06-01 NOTE — Anesthesia Procedure Notes (Signed)
Procedure Name: Intubation Performed by: Shanicqua Coldren J Pre-anesthesia Checklist: Patient identified, Emergency Drugs available, Suction available, Patient being monitored and Timeout performed Patient Re-evaluated:Patient Re-evaluated prior to inductionOxygen Delivery Method: Circle system utilized Preoxygenation: Pre-oxygenation with 100% oxygen Intubation Type: IV induction Ventilation: Mask ventilation without difficulty Laryngoscope Size: Mac and 3 Grade View: Grade I Tube type: Oral Tube size: 7.5 mm Number of attempts: 1 Airway Equipment and Method: Stylet Placement Confirmation: ETT inserted through vocal cords under direct vision,  positive ETCO2,  CO2 detector and breath sounds checked- equal and bilateral Secured at: 23 cm Tube secured with: Tape Dental Injury: Teeth and Oropharynx as per pre-operative assessment        

## 2016-06-01 NOTE — Op Note (Signed)
Preoperative diagnosis: Left renal neoplasm  Postoperative diagnosis: Left renal neoplasm  Procedure:  1. Left robotic-assisted laparoscopic partial nephrectomy 2. Intraoperative renal ultrasonography  Surgeon: Pryor Curia. M.D.  Assistant(s): Debbrah Alar, PA-C  An assistant was required for this surgical procedure.  The duties of the assistant included but were not limited to suctioning, passing suture, camera manipulation, retraction. This procedure would not be able to be performed without an Environmental consultant.  Anesthesia: General  Complications: None  EBL: 50 mL  IVF:  2000 mL crystalloid  Specimens: 1. Left renal neoplasm  Disposition of specimens: Pathology  Intraoperative findings:       1. Warm renal ischemia time: 16 minutes       2. Intraoperative renal ultrasound findings: There was a 2.6 cm partially cystic heterogenous mass off the upper pole of the left kidney.  Drains: 1. # 15 Blake perinephric drain  Indication:  Jamie Rivers is a 35 y.o. year old patient with a left renal neoplasm.  After a thorough review of the management options for their renal mass, they elected to proceed with surgical treatment and the above procedure.  We have discussed the potential benefits and risks of the procedure, side effects of the proposed treatment, the likelihood of the patient achieving the goals of the procedure, and any potential problems that might occur during the procedure or recuperation. Informed consent has been obtained.   Description of procedure:  The patient was taken to the operating room and a general anesthetic was administered. The patient was given preoperative antibiotics, placed in the left modified flank position with care to pad all potential pressure points, and prepped and draped in the usual sterile fashion. Next a preoperative timeout was performed.  A site was selected in the upper midline for initial port placement. This was placed using a  standard open Hassan technique which allowed entry into the peritoneal cavity under direct vision and without difficulty. A 12 mm port was placed and a pneumoperitoneum established. The camera was then used to inspect the abdomen and there was no evidence of any intra-abdominal injuries or other abnormalities. The remaining abdominal ports were then placed. 8 mm robotic ports were placed in the left upper quadrant, left lower quadrant, and far left lateral abdominal wall. A 8 mm port was placed to the left of the midline just off the rectus muscle for the camera.. All ports were placed under direct vision without difficulty. The surgical cart was then docked.   Utilizing the cautery scissors, the white line of Toldt was incised allowing the colon to be mobilized medially and the plane between the mesocolon and the anterior layer of Gerota's fascia to be developed and the kidney to be exposed.  The ureter and gonadal vein were identified inferiorly and the ureter was lifted anteriorly off the psoas muscle.  Dissection proceeded superiorly along the gonadal vein until the renal vein was identified.  The renal hilum was then carefully isolated with a combination of blunt and sharp dissection allowing the renal arterial and venous structures to be separated and isolated in preparation for renal hilar vessel clamping. There was a main renal artery and a smaller upper pole renal artery.  There was a circumaortic renal vein.  12.5 g of IV mannitol was then administered.   Attention turned to the kidney and the perinephric fat surrounding the renal mass was removed and the kidney was mobilized sufficiently for exposure and resection of the renal mass.   Intraoperative renal  ultrasonography was utilized with the drop in ultrasound probe to identify the renal tumor and identify the tumor margins.  The tumor margins were marked using ultrasound guidance.  Once the renal mass was properly isolated, preparations were  made for resection of the tumor.  Reconstructive sutures were placed into the abdomen for the renorrhaphy portion of the procedure.  The main renal artery and the upper pole renal artery were then clamped with bulldog clamps.  The tumor was then excised with cold scissor dissection along with an adequate visible gross margin of normal renal parenchyma. The tumor appeared to be excised without any gross violation of the tumor. The renal collecting system did not appear to be entered during removal of the tumor.  A running 3-0 V-lock suture was then brought through the capsule of the kidney and run along the base of the renal defect to provide hemostasis and close any entry into the renal collecting system if present. Weck clips were used to secure this suture outside the renal capsule at the proximal and distal ends. An additional hemostatic agent (Surgiflo) was then placed into the renal defect. A running 2-0 V lock suture was then used to close the renal capsule using a sliding clip technique which resulted in excellent compression of the renal defect.    The bulldog clamps were then removed from the renal hilar vessel(s) and an additional 12.5 g of IV mannitol was administered. Total warm renal ischemia time was 16 minutes. The renal tumor resection site was examined. Hemostasis appeared adequate.   The kidney was placed back into its normal anatomic position and covered with perinephric fat as needed.  A # 49 Blake drain was then brought through the lateral lower port site and positioned in the perinephric space.  It was secured to the skin with a nylon suture. The surgical cart was undocked.  The renal tumor specimen was removed intact within an endopouch retrieval bag via the upper midline port site.  All other laparoscopic/robotic ports had been removed under direct vision and the pneumoperitoneum let down with inspection of the operative field performed and hemostasis again confirmed. The upper midline  incision was then closed at the fascial level with 0-vicryl suture. All incision sites were then injected with local anesthetic and reapproximated at the skin level with 4-0 monocryl subcuticular closures.  Liquiband was applied to the skin.  The patient tolerated the procedure well and without complications.  The patient was able to be extubated and transferred to the recovery unit in satisfactory condition.  Pryor Curia MD

## 2016-06-01 NOTE — Anesthesia Preprocedure Evaluation (Signed)
Anesthesia Evaluation  Patient identified by MRN, date of birth, ID band Patient awake    Reviewed: Allergy & Precautions, NPO status , Patient's Chart, lab work & pertinent test results  Airway Mallampati: II  TM Distance: >3 FB Neck ROM: Full    Dental  (+) Teeth Intact, Poor Dentition, Dental Advisory Given   Pulmonary neg pulmonary ROS,    breath sounds clear to auscultation       Cardiovascular negative cardio ROS   Rhythm:Regular Rate:Normal     Neuro/Psych Surgery for brain tumor    GI/Hepatic negative GI ROS, Neg liver ROS,   Endo/Other    Renal/GU Per surgeon     Musculoskeletal negative musculoskeletal ROS (+)   Abdominal   Peds  Hematology negative hematology ROS (+)   Anesthesia Other Findings   Reproductive/Obstetrics                             Anesthesia Physical Anesthesia Plan  ASA: II  Anesthesia Plan: General   Post-op Pain Management:    Induction: Intravenous  Airway Management Planned: Oral ETT  Additional Equipment:   Intra-op Plan:   Post-operative Plan: Extubation in OR  Informed Consent: I have reviewed the patients History and Physical, chart, labs and discussed the procedure including the risks, benefits and alternatives for the proposed anesthesia with the patient or authorized representative who has indicated his/her understanding and acceptance.     Plan Discussed with: CRNA  Anesthesia Plan Comments:         Anesthesia Quick Evaluation

## 2016-06-01 NOTE — Progress Notes (Signed)
Patient ID: Jamie Rivers, male   DOB: 12/21/1980, 35 y.o.   MRN: [id number]  Post-op note  Subjective: The patient is doing well.  No complaints.  Objective: Vital signs in last 24 hours: Temp:  [97.4 F (36.3 C)-97.7 F (36.5 C)] 97.7 F (36.5 C) (09/25 1540) Pulse Rate:  [85-108] 99 (09/25 1540) Resp:  [10-19] 14 (09/25 1515) BP: (121-148)/(82-89) 135/89 (09/25 1540) SpO2:  [100 %] 100 % (09/25 1540) Weight:  [49.4 kg (109 lb)] 49.4 kg (109 lb) (09/25 0942)  Intake/Output from previous day: No intake/output data recorded. Intake/Output this shift: Total I/O In: 1950 [I.V.:1950] Out: 395 [Urine:325; Drains:20; Blood:50]  Physical Exam:  General: Alert and oriented. Abdomen: Soft, Nondistended. Incisions: Clean and dry.  Lab Results:  Recent Labs  06/01/16 1454  HGB 12.0*  HCT 34.8*    Assessment/Plan: POD#0   1) Continue to monitor, bedrest tonight   Pryor Curia. Jamie Rivers   LOS: 0 days   Jamie Rivers,Jamie Rivers, Jamie Rivers

## 2016-06-01 NOTE — Transfer of Care (Signed)
Immediate Anesthesia Transfer of Care Note  Patient: Jamie Rivers  Procedure(s) Performed: Procedure(s): XI ROBOTIC ASSITED PARTIAL NEPHRECTOMY (Left)  Patient Location: PACU  Anesthesia Type:General  Level of Consciousness: sedated, patient cooperative and responds to stimulation  Airway & Oxygen Therapy: Patient Spontanous Breathing and Patient connected to face mask oxygen  Post-op Assessment: Report given to RN and Post -op Vital signs reviewed and stable  Post vital signs: Reviewed and stable  Last Vitals:  Vitals:   06/01/16 0915  BP: 124/89  Pulse: 85  Resp: 16  Temp: 36.3 C    Last Pain:  Vitals:   06/01/16 0915  TempSrc: Oral      Patients Stated Pain Goal: 3 ([id number])  Complications: No apparent anesthesia complications

## 2016-06-01 NOTE — Anesthesia Postprocedure Evaluation (Signed)
Anesthesia Post Note  Patient: Jamie Rivers  Procedure(s) Performed: Procedure(s) (LRB): XI ROBOTIC ASSITED PARTIAL NEPHRECTOMY (Left)  Patient location during evaluation: PACU Anesthesia Type: General Level of consciousness: awake Pain management: pain level controlled Vital Signs Assessment: post-procedure vital signs reviewed and stable Respiratory status: spontaneous breathing Cardiovascular status: stable Postop Assessment: no signs of nausea or vomiting Anesthetic complications: no    Last Vitals:  Vitals:   06/01/16 1445 06/01/16 1500  BP: 136/84 129/85  Pulse: 100 96  Resp: 10 12  Temp: 36.4 C     Last Pain:  Vitals:   06/01/16 1500  TempSrc:   PainSc: 5                  Durenda Pechacek

## 2016-06-02 LAB — BASIC METABOLIC PANEL
ANION GAP: 7 (ref 5–15)
BUN: 7 mg/dL (ref 6–20)
CALCIUM: 8.6 mg/dL — AB (ref 8.9–10.3)
CHLORIDE: 106 mmol/L (ref 101–111)
CO2: 26 mmol/L (ref 22–32)
Creatinine, Ser: 0.85 mg/dL (ref 0.61–1.24)
GFR calc non Af Amer: >60 mL/min (ref >60)
GLUCOSE: 134 mg/dL — AB (ref 65–99)
POTASSIUM: 3.7 mmol/L (ref 3.5–5.1)
Sodium: 139 mmol/L (ref 135–145)

## 2016-06-02 LAB — HEMOGLOBIN AND HEMATOCRIT, BLOOD
HEMATOCRIT: 36.9 % — AB (ref 39.0–52.0)
Hemoglobin: 12.5 g/dL — ABNORMAL LOW (ref 13.0–17.0)

## 2016-06-02 LAB — CREATININE, FLUID (PLEURAL, PERITONEAL, JP DRAINAGE): CREAT FL: 0.9 mg/dL

## 2016-06-02 MED ORDER — BISACODYL 10 MG RE SUPP
10.0000 mg | Freq: Once | RECTAL | Status: AC
  Administered 2016-06-02: 10 mg via RECTAL
  Filled 2016-06-02: qty 1

## 2016-06-02 MED ORDER — PREMIER PROTEIN SHAKE
11.0000 [oz_av] | Freq: Two times a day (BID) | ORAL | Status: DC
  Filled 2016-06-02: qty 325.31

## 2016-06-02 MED ORDER — HYDROCODONE-ACETAMINOPHEN 5-325 MG PO TABS
1.0000 | ORAL_TABLET | Freq: Four times a day (QID) | ORAL | Status: DC | PRN
  Administered 2016-06-02 – 2016-06-03 (×3): 1 via ORAL
  Filled 2016-06-02: qty 2
  Filled 2016-06-02 (×2): qty 1

## 2016-06-02 MED ORDER — BOOST / RESOURCE BREEZE PO LIQD
1.0000 | Freq: Three times a day (TID) | ORAL | Status: DC
  Administered 2016-06-02 – 2016-06-03 (×2): 1 via ORAL

## 2016-06-02 NOTE — Progress Notes (Signed)
Initial Nutrition Assessment  DOCUMENTATION CODES:   Severe malnutrition in context of chronic illness, Underweight  INTERVENTION:   -Continue Boost Breeze po TID, each supplement provides 250 kcal and 9 grams of protein -Provide Premier Protein supplements BID, each provides 160 kcal and 30 g protein -Encourage small, frequent meals -RD to continue to monitor  NUTRITION DIAGNOSIS:   Malnutrition related to chronic illness as evidenced by percent weight loss, severe depletion of body fat, severe depletion of muscle mass.  GOAL:   Patient will meet greater than or equal to 90% of their needs  MONITOR:   PO intake, Supplement acceptance, Labs, Weight trends, I & O's  REASON FOR ASSESSMENT:   Malnutrition Screening Tool    ASSESSMENT:   35 year old gentleman seen today at the request of Jaynee Eagles, PA-C for an incidentally detected left renal neoplasm. He has had nonspecific left upper quadrant pain that began in February and has been persistent since then. He also has noted increasing fatigue and weight loss having dropped from 135 pounds to 108 pounds since February. His weight loss has plateaued over the past few months. He has not noted any hematuria. He has no family history of kidney cancer or end-stage renal disease. He is otherwise quite healthy with no major medical comorbidities.   9/25: s/p Left robotic-assisted laparoscopic partial nephrectomy, Intraoperative renal ultrasonography  Patient in room with mother at bedside. Pt reports good appetite with no recent changes. Pt typically consuming 4-5 small meals a day which consist of carbohydrates, protein and fat. Pt states his small meals may consist of a sandwich as an example. Pt does eat meat. Pt denies swallowing or chewing issues. Pt currently consuming 25-50% of meals at this time. Pt has consumed Premier Protein supplements in the past. States he likes the [employer] supplements provided. RD will continue Boost  Breeze supplement order and also provide Premier Protein for additional protein and calories given catabolic illness.  Per patient, UBW is between 132-140 lb. Pt presents with 23 lb weight loss since February (17% wt loss x 7 months, significant for time frame). Nutrition-Focused physical exam completed. Findings are severe fat depletion, severe muscle depletion, and no edema.   Labs reviewed. Medications: Colace capsule, D5 -.45% NaCl infusion at 75 ml/hr- provides 306 kcal  Diet Order:  Diet regular Room service appropriate? Yes; Fluid consistency: Thin  Skin:  Wound (see comment) (9/25 abdominal incision)  Last BM:  PTA  Height:   Ht Readings from Last 1 Encounters:  06/01/16 5\' 7"  (1.702 m)    Weight:   Wt Readings from Last 1 Encounters:  06/01/16 109 lb (49.4 kg)    Ideal Body Weight:  73 kg  BMI:  Body mass index is 17.07 kg/m.  Estimated Nutritional Needs:   Kcal:  1700-1900  Protein:  75-90g  Fluid:  1.7-1.9L/day  EDUCATION NEEDS:   Education needs addressed  Clayton Bibles, MS, RD, LDN Pager: [phone] After Hours Pager: [phone]

## 2016-06-02 NOTE — Progress Notes (Signed)
Patient ID: Jamie Rivers, male   DOB: 14-Jun-1981, 35 y.o.   MRN: [id number]  1 Day Post-Op Subjective: Pain controlled.  No nausea or vomiting.  No flatus.  Objective: Vital signs in last 24 hours: Temp:  [97.4 F (36.3 C)-98.8 F (37.1 C)] 98.6 F (37 C) (09/26 0518) Pulse Rate:  [80-108] 101 (09/26 0518) Resp:  [10-19] 16 (09/26 0518) BP: (116-148)/(78-93) 131/88 (09/26 0518) SpO2:  [100 %] 100 % (09/26 0518) Weight:  [49.4 kg (109 lb)] 49.4 kg (109 lb) (09/25 0942)  Intake/Output from previous day: 09/25 0701 - 09/26 0700 In: 4750 [P.O.:480; I.V.:3920; IV Piggyback:350] Out: [id number] [Urine:4275; Drains:110; Blood:50] Intake/Output this shift: No intake/output data recorded.  Physical Exam:  General: Alert and oriented CV: RRR Lungs: Clear Abdomen: Soft, ND, positive BS Incisions: C/D/I GU: Urine clear Ext: NT, No erythema  Lab Results:  Recent Labs  06/01/16 1454 06/02/16 0442  HGB 12.0* 12.5*  HCT 34.8* 36.9*   BMET  Recent Labs  06/01/16 1454 06/02/16 0442  NA 137 139  K 3.7 3.7  CL 104 106  CO2 26 26  GLUCOSE 163* 134*  BUN 13 7  CREATININE 1.06 0.85  CALCIUM 8.8* 8.6*     Studies/Results: Pathology pending.  Assessment/Plan: POD # 1 s/p right RAL partial nephrectomy - D/C Foley - Ambulate, IS - Advance diet - Dulcolax suppository - Po pain medication - Monitor renal function, drain output   LOS: 1 day   [name],LES 06/02/2016, 7:51 AM

## 2016-06-03 LAB — BASIC METABOLIC PANEL
Anion gap: 7 (ref 5–15)
BUN: 12 mg/dL (ref 6–20)
CHLORIDE: 104 mmol/L (ref 101–111)
CO2: 26 mmol/L (ref 22–32)
CREATININE: 0.9 mg/dL (ref 0.61–1.24)
Calcium: 9.1 mg/dL (ref 8.9–10.3)
GFR calc non Af Amer: >60 mL/min (ref >60)
GLUCOSE: 113 mg/dL — AB (ref 65–99)
Potassium: 3.8 mmol/L (ref 3.5–5.1)
Sodium: 137 mmol/L (ref 135–145)

## 2016-06-03 LAB — CREATININE, FLUID (PLEURAL, PERITONEAL, JP DRAINAGE): CREAT FL: 0.9 mg/dL

## 2016-06-03 NOTE — Progress Notes (Signed)
Patient ID: Jamie Rivers, male   DOB: [birth year], 35 y.o.   MRN: [id number]  2 Days Post-Op Subjective: Pt doing well.  No complaints.  Ambulating well.  Pain controlled.  Objective: Vital signs in last 24 hours: Temp:  [98.6 F (37 C)-98.9 F (37.2 C)] 98.7 F (37.1 C) (09/27 0442) Pulse Rate:  [103-118] 103 (09/27 0442) Resp:  [18-20] 18 (09/27 0442) BP: (109-150)/(75-98) 130/98 (09/27 0442) SpO2:  [99 %-100 %] 99 % (09/27 0442)  Intake/Output from previous day: 09/26 0701 - 09/27 0700 In: [id number] [P.O.:600; I.V.:915] Out: 2250 [Urine:2150; Drains:100] Intake/Output this shift: Total I/O In: 120 [P.O.:120] Out: 520 [Urine:500; Drains:20]  Physical Exam:  General: Alert and oriented CV: RRR Lungs: Clear Abdomen: Soft, ND Incisions: C/D/I Ext: NT, No erythema  Lab Results:  Recent Labs  06/01/16 1454 06/02/16 0442  HGB 12.0* 12.5*  HCT 34.8* 36.9*   BMET  Recent Labs  06/02/16 0442 06/03/16 0455  NA 139 137  K 3.7 3.8  CL 106 104  CO2 26 26  GLUCOSE 134* 113*  BUN 7 12  CREATININE 0.85 0.90  CALCIUM 8.6* 9.1   Drain Cr c/w serum  Studies/Results: Path: pT1a Nx Mx, 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

## 2016-06-03 NOTE — Discharge Summary (Signed)
  Date of admission: 06/01/2016  Date of discharge: 06/03/2016  Admission diagnosis: left renal neoplasm  Discharge diagnosis: renal cell carcinoma  Secondary diagnoses: none  History and Physical: For full details, please see admission history and physical. Briefly, Jamie Rivers is a 35 y.o. year old patient with an incidentally detected left renal neoplasm concerning for malignancy.Marland Kitchen   Hospital Course: He was taken to the operating room on 06/01/16 and underwent a left robot-assisted laparoscopic partial nephrectomy.  This procedure was uneventful and he was maintained on bed rest that evening.  He remained hemodynamically stable and was able to begin ambulating and having his diet advanced on postoperative day 1.  His renal function remained stable.  He had minimal output from his perinephric drain.  A creatinine level from his drain was consistent with serum and was removed.  He was stable for discharge home on postoperative day 2.  Laboratory values:  Recent Labs  06/01/16 1454 06/02/16 0442  HGB 12.0* 12.5*  HCT 34.8* 36.9*    Recent Labs  06/02/16 0442 06/03/16 0455  CREATININE 0.85 0.90    Disposition: Home  Discharge instruction: The patient was instructed to be ambulatory but told to refrain from heavy lifting, strenuous activity, or driving.   Discharge medications:    Medication List    STOP taking these medications   amoxicillin-clavulanate 875-125 MG tablet Commonly known as:  AUGMENTIN   naproxen 500 MG tablet Commonly known as:  NAPROSYN   naproxen sodium 220 MG tablet Commonly known as:  ANAPROX     TAKE these medications   HYDROcodone-acetaminophen 5-325 MG tablet Commonly known as:  NORCO Take 1-2 tablets by mouth every 6 (six) hours as needed for moderate pain or severe pain.   LORazepam 1 MG tablet Commonly known as:  ATIVAN Take 1 tablet (1 mg total) by mouth 2 (two) times daily.       Followup:  Follow-up Information    Ferris Fielden,LES, MD  Follow up on 06/23/2016.   Specialty:  Urology Why:  at 10:30 Contact information: [place]  [id number] [phone]

## 2016-12-18 ENCOUNTER — Ambulatory Visit (HOSPITAL_COMMUNITY)
Admission: RE | Admit: 2016-12-18 | Discharge: 2016-12-18 | Disposition: A | Discharge status: Home / Self Care | Payer: BLUE CROSS/BLUE SHIELD | Source: Ambulatory Visit | Attending: Urology | Admitting: Urology

## 2016-12-18 ENCOUNTER — Other Ambulatory Visit (HOSPITAL_COMMUNITY): Payer: Self-pay | Admitting: Urology

## 2016-12-18 DIAGNOSIS — C642 Malignant neoplasm of left kidney, except renal pelvis: Secondary | ICD-10-CM

## 2016-12-18 DIAGNOSIS — R918 Other nonspecific abnormal finding of lung field: Secondary | ICD-10-CM | POA: Insufficient documentation

## 2017-01-14 DIAGNOSIS — D49512 Neoplasm of unspecified behavior of left kidney: Secondary | ICD-10-CM

## 2017-08-10 DIAGNOSIS — D49512 Neoplasm of unspecified behavior of left kidney: Secondary | ICD-10-CM

## 2018-05-25 ENCOUNTER — Ambulatory Visit
Admission: RE | Admit: 2018-05-25 | Discharge: 2018-05-25 | Disposition: A | Discharge status: Home / Self Care | Payer: No Typology Code available for payment source | Source: Ambulatory Visit | Attending: *Deleted | Admitting: *Deleted

## 2018-05-25 ENCOUNTER — Other Ambulatory Visit: Payer: Self-pay | Admitting: *Deleted

## 2018-05-25 DIAGNOSIS — R7611 Nonspecific reaction to tuberculin skin test without active tuberculosis: Secondary | ICD-10-CM

## 2018-10-06 ENCOUNTER — Emergency Department (HOSPITAL_COMMUNITY)
Admission: EM | Admit: 2018-10-06 | Discharge: 2018-10-07 | Disposition: A | Discharge status: Other Acute Inpatient Hospital | Payer: BLUE CROSS/BLUE SHIELD | Attending: Emergency Medicine | Admitting: Emergency Medicine

## 2018-10-06 ENCOUNTER — Other Ambulatory Visit: Payer: Self-pay

## 2018-10-06 ENCOUNTER — Encounter (HOSPITAL_COMMUNITY): Payer: Self-pay | Admitting: *Deleted

## 2018-10-06 DIAGNOSIS — R4589 Other symptoms and signs involving emotional state: Secondary | ICD-10-CM

## 2018-10-06 DIAGNOSIS — F329 Major depressive disorder, single episode, unspecified: Secondary | ICD-10-CM

## 2018-10-06 DIAGNOSIS — F1020 Alcohol dependence, uncomplicated: Secondary | ICD-10-CM | POA: Insufficient documentation

## 2018-10-06 DIAGNOSIS — F333 Major depressive disorder, recurrent, severe with psychotic symptoms: Secondary | ICD-10-CM | POA: Insufficient documentation

## 2018-10-06 DIAGNOSIS — F419 Anxiety disorder, unspecified: Secondary | ICD-10-CM | POA: Insufficient documentation

## 2018-10-06 DIAGNOSIS — Z79899 Other long term (current) drug therapy: Secondary | ICD-10-CM | POA: Insufficient documentation

## 2018-10-06 LAB — COMPREHENSIVE METABOLIC PANEL
ALBUMIN: 4.7 g/dL (ref 3.5–5.0)
ALK PHOS: 58 U/L (ref 38–126)
ALT: 34 U/L (ref 0–44)
ANION GAP: 9 (ref 5–15)
AST: 21 U/L (ref 15–41)
BILIRUBIN TOTAL: 0.7 mg/dL (ref 0.3–1.2)
BUN: 17 mg/dL (ref 6–20)
CALCIUM: 9.4 mg/dL (ref 8.9–10.3)
CO2: 24 mmol/L (ref 22–32)
CREATININE: 0.98 mg/dL (ref 0.61–1.24)
Chloride: 104 mmol/L (ref 98–111)
GFR calc Af Amer: >60 mL/min (ref >60)
GFR calc non Af Amer: >60 mL/min (ref >60)
GLUCOSE: 135 mg/dL — AB (ref 70–99)
Potassium: 3.8 mmol/L (ref 3.5–5.1)
Sodium: 137 mmol/L (ref 135–145)
TOTAL PROTEIN: 7.7 g/dL (ref 6.5–8.1)

## 2018-10-06 LAB — RAPID URINE DRUG SCREEN, HOSP PERFORMED
Amphetamines: NOT DETECTED
BARBITURATES: NOT DETECTED
Benzodiazepines: NOT DETECTED
COCAINE: NOT DETECTED
Opiates: NOT DETECTED
Tetrahydrocannabinol: NOT DETECTED

## 2018-10-06 LAB — CBC WITH DIFFERENTIAL/PLATELET
Abs Immature Granulocytes: 0.04 10*3/uL (ref 0.00–0.07)
BASOS ABS: 0 10*3/uL (ref 0.0–0.1)
Basophils Relative: 0 %
EOS PCT: 1 %
Eosinophils Absolute: 0.1 10*3/uL (ref 0.0–0.5)
HEMATOCRIT: 42.4 % (ref 39.0–52.0)
HEMOGLOBIN: 14 g/dL (ref 13.0–17.0)
Immature Granulocytes: 0 %
LYMPHS ABS: 1.9 10*3/uL (ref 0.7–4.0)
LYMPHS PCT: 21 %
MCH: 28.7 pg (ref 26.0–34.0)
MCHC: 33 g/dL (ref 30.0–36.0)
MCV: 87.1 fL (ref 80.0–100.0)
MONO ABS: 0.5 10*3/uL (ref 0.1–1.0)
Monocytes Relative: 5 %
NRBC: 0 % (ref 0.0–0.2)
Neutro Abs: 6.6 10*3/uL (ref 1.7–7.7)
Neutrophils Relative %: 73 %
Platelets: 375 10*3/uL (ref 150–400)
RBC: 4.87 MIL/uL (ref 4.22–5.81)
RDW: 14.1 % (ref 11.5–15.5)
WBC: 9.1 10*3/uL (ref 4.0–10.5)

## 2018-10-06 LAB — ETHANOL: Alcohol, Ethyl (B): <10 mg/dL (ref <10)

## 2018-10-06 MED ORDER — SERTRALINE HCL 50 MG PO TABS: 150.0000 mg | ORAL_TABLET | Freq: Every day | ORAL | Status: DC

## 2018-10-06 MED ORDER — TRAZODONE HCL 50 MG PO TABS
50.0000 mg | ORAL_TABLET | Freq: Every day | ORAL | Status: DC
  Administered 2018-10-06: 50 mg via ORAL
  Filled 2018-10-06: qty 1

## 2018-10-06 MED ORDER — HYDROXYZINE HCL 25 MG PO TABS: 25.0000 mg | ORAL_TABLET | Freq: Three times a day (TID) | ORAL | Status: DC | PRN

## 2018-10-06 MED ORDER — ADULT MULTIVITAMIN W/MINERALS CH
1.0000 | ORAL_TABLET | Freq: Every day | ORAL | Status: DC
  Administered 2018-10-06 – 2018-10-07 (×2): 1 via ORAL
  Filled 2018-10-06 (×2): qty 1

## 2018-10-06 MED ORDER — NALTREXONE HCL 50 MG PO TABS
50.0000 mg | ORAL_TABLET | Freq: Every day | ORAL | Status: DC
  Administered 2018-10-06: 50 mg via ORAL
  Filled 2018-10-06 (×2): qty 1

## 2018-10-06 NOTE — BH Assessment (Signed)
Assessment Note  Jamie Rivers is an 38 y.o. male presenting voluntarily to [facility] ED complaining of worsening depression, anxiety, and paranoia. Patient was referred by his psychiatrist at [facility] after their session today. Patient reports feeling "deeply depressed." He endorses symptoms of hopelessness, worthlessness, irritability, difficulty with sleep, isolation, anhedonia, and difficulty with sleep. Patient reports these symptoms have become more severe over the past week. He reports passive suicidal ideation in the past like "I wish my cancer would come back so I wouldn't have to live anymore." Patient denies current SI or any prior attempts/ self harming behavior. Patient is currently living at [facility] and has 7 roommates who voiced concerns to him about him being "paranoid, delusional, and thoughts are hard to follow." Patient states he hears voices of people that are familiar to him and often believes he hears his roommates speaking negatively about him. Patient denies VH. Patient unsure if these are hallucinations or are related to his anxiety. Patient reports daily "binge drinking" for 12 years. Patient went to a 90 day program at [facility] and has maintained his sobriety since September. Patient denies any other substance use. There is a history of mental illness in his family including his mother with depression, aunt with schizophrenia, and his father was an alcoholic. 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 Patient denies any current criminal charges or abuse history.  Patient was alert and oriented x 4. He was dressed in scrubs and sitting up right in bed. Patient's speech was logical and coherent. He made appropriate eye contact. His mood and affect were depressed. His insight, judgement, and impulse control were partial. Patient did not appear to be responding to  internal stimuli or experiencing delusional thought content at time of assessment.  Jamie Blossom, NP recommends patient be observed overnight for safety and stabilization and reassessed in the morning.   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 Anxiety   . 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 bone graft surgery from right hip to support tooth      age 70   . brain tumor surgery      age 42 per patient - benign per patient   . CLEFT PALATE REPAIR    . closure of roof of mouth surgery      age 34   . ROBOTIC ASSITED PARTIAL NEPHRECTOMY Left 06/01/2016   Procedure: XI ROBOTIC ASSITED PARTIAL NEPHRECTOMY;  Surgeon: Raynelle Bring, MD;  Location: [facility] ORS;  Service: Urology;  Laterality: Left;    Family History:  Family History  Problem Relation Age of Onset  . Hypertension Mother     Social History:  reports that he has never smoked. He has never used smokeless tobacco. He reports previous alcohol use. He reports that he does not use drugs.  Additional Social History:  Alcohol / Drug Use Pain Medications: see MAR Prescriptions: see MAR Over the Counter: see MAR History of alcohol / drug use?: Yes Substance #1 Name of Substance 1: Alcohol 1 - Age of First Use: 21 1 - Amount (size/oz): "binge drinking" 1 - Frequency: daily 1 - Duration: 12 years 1 - Last Use / Amount: September 2019  CIWA: CIWA-Ar BP: 117/87 Pulse Rate: 79 COWS:    Allergies: No Known Allergies  Home Medications: (Not in a hospital admission)   OB/GYN Status:  No LMP for male patient.  General Assessment Data Location of Assessment: [facility] TTS Assessment: In system Is this a Tele or Face-to-Face Assessment?: Face-to-Face Is this an Initial Assessment or a Re-assessment for this  encounter?: Initial Assessment Patient Accompanied by:: N/A Language Other than English: No Living Arrangements: [employer]) What gender do you identify as?: Male Marital status: Single Maiden name: Krummel Pregnancy Status: No Living Arrangements: Non-relatives/Friends, Group Home Can pt return to current living arrangement?: Yes Admission Status: Voluntary Is patient capable of signing voluntary admission?: Yes Referral Source: Psychiatrist Insurance type: None     Crisis Care Plan Living Arrangements: Non-relatives/Friends, Group Home  Education Status Is patient currently in school?: No Is the patient employed, unemployed or receiving disability?: Employed  Risk to self with the past 6 months Suicidal Ideation: No-Not Currently/Within Last 6 Months Has patient been a risk to self within the past 6 months prior to admission? : No Suicidal Intent: No Has patient had any suicidal intent within the past 6 months prior to admission? : No Is patient at risk for suicide?: No Suicidal Plan?: No Has patient had any suicidal plan within the past 6 months prior to admission? : No Access to Means: No What has been your use of drugs/alcohol within the last 12 months?: sober from alcohol for 5 months Previous Attempts/Gestures: No How many times?: 0 Other Self Harm Risks: none Triggers for Past Attempts: None known Intentional Self Injurious Behavior: None Family Suicide History: Yes(mother's cousin) Recent stressful life event(s): Conflict (Comment), Turmoil (Comment)(conflict with roommate, sobriety) Persecutory voices/beliefs?: Yes Depression: Yes Depression Symptoms: Despondent, Tearfulness, Isolating, Fatigue, Loss of interest in usual pleasures, Feeling worthless/self pity Substance abuse history and/or treatment for substance abuse?: Yes Suicide prevention information given to non-admitted patients: Not applicable  Risk to Others within the past 6 months Homicidal  Ideation: No Does patient have any lifetime risk of violence toward others beyond the six months prior to admission? : No Thoughts of Harm to Others: No Current Homicidal Intent: No Current Homicidal Plan: No Access to Homicidal Means: No Identified Victim: none History of harm to others?: No Assessment of Violence: None Noted Violent Behavior Description: none noted Does patient have access to weapons?: No Criminal Charges Pending?: No Does patient have a court date: No Is patient on probation?: No  Psychosis Hallucinations: Auditory Delusions: Persecutory  Mental Status Report Appearance/Hygiene: Unremarkable, In scrubs Eye Contact: Good Motor Activity: Freedom of movement Speech: Logical/coherent Level of Consciousness: Alert Mood: Depressed Affect: Depressed Anxiety Level: Minimal Thought Processes: Coherent, Relevant Judgement: Partial Orientation: Person, Place, Time, Situation Obsessive Compulsive Thoughts/Behaviors: None  Cognitive Functioning Concentration: Normal Memory: Recent Intact, Remote Intact Is patient IDD: No Insight: Fair Impulse Control: Fair Appetite: Good Have you had any weight changes? : No Change Sleep: Decreased Total Hours of Sleep: 7(wakes up frequently) Vegetative Symptoms: Staying in bed  ADLScreening [facility] Assessment Services) Patient's cognitive ability adequate to safely complete daily activities?: Yes Patient able to express need for assistance with ADLs?: Yes Independently performs ADLs?: Yes (appropriate for developmental age)  Prior Inpatient Therapy Prior Inpatient Therapy: Yes Prior Therapy Dates: 2019 Prior Therapy Facilty/Provider(s): [facility] Reason for Treatment: Alcohol addiction  Prior Outpatient Therapy Prior Outpatient Therapy: Yes Prior Therapy Dates: current Prior Therapy Facilty/Provider(s): [facility] Reason for Treatment: medication management Does patient  have an ACCT team?: No Does patient  have Intensive In-House Services?  : No Does patient have [facility] services? : No Does patient have P4CC services?: No  ADL Screening (condition at time of admission) Patient's cognitive ability adequate to safely complete daily activities?: Yes Is the patient deaf or have difficulty hearing?: No Does the patient have difficulty seeing, even when wearing glasses/contacts?: No Does the patient have difficulty concentrating, remembering, or making decisions?: No Patient able to express need for assistance with ADLs?: Yes Does the patient have difficulty dressing or bathing?: No Independently performs ADLs?: Yes (appropriate for developmental age) Does the patient have difficulty walking or climbing stairs?: No Weakness of Legs: None Weakness of Arms/Hands: None  Home Assistive Devices/Equipment Home Assistive Devices/Equipment: None  Therapy Consults (therapy consults require a physician order) PT Evaluation Needed: No OT Evalulation Needed: No SLP Evaluation Needed: No Abuse/Neglect Assessment (Assessment to be complete while patient is alone) Abuse/Neglect Assessment Can Be Completed: Yes Physical Abuse: Denies Verbal Abuse: Denies Sexual Abuse: Denies Exploitation of patient/patient's resources: Denies Self-Neglect: Denies Values / Beliefs Cultural Requests During Hospitalization: None Spiritual Requests During Hospitalization: None Consults Spiritual Care Consult Needed: No Social Work Consult Needed: No Regulatory affairs officer (For Healthcare) Does Patient Have a Medical Advance Directive?: No Would patient like information on creating a medical advance directive?: No - Patient declined          Disposition: Jamie Blossom, NP recommends patient be observed overnight for safety and stabilization and reassessed in the morning Disposition Initial Assessment Completed for this Encounter: Yes  On Site Evaluation by:   Reviewed with Physician:    Orvis Brill 10/06/2018  6:48 PM

## 2018-10-06 NOTE — ED Notes (Signed)
Pt notes he's hearing voices. Pt states " I hear my family, they're concerned about me". Pt denies any si or hi at this time. Pt safe, will continue to monitor.

## 2018-10-06 NOTE — ED Notes (Signed)
up to the bathroom, TTS into see

## 2018-10-06 NOTE — ED Notes (Addendum)
Pt reports that he was in [facility] for [facility] day program, which he completed and was dc'd 06/15/18.  Pt reports that they started him on medications and he has been followed at [facility] by one of their MD's.  Pt reports that he has been "feeling strange".. has been feeling increasingly depressed and anxious.  Pt denies si/hi/avh at this time, but reports that he has had periods tha he  Hears voices (roomates/house mates talking).  Pt reports that he was at his MD's appt today and his MD recommended he come here for follow up.

## 2018-10-06 NOTE — ED Notes (Signed)
Bed: [facility] Expected date:  Expected time:  Means of arrival:  Comments: Hold for triage

## 2018-10-06 NOTE — ED Notes (Signed)
Pt alert and cooperative. Pt answers questions appropriately. Pt answers questions with a delayed response. Pt denies any si, hi, and avh at this time. Pt resting in bed at this time. Pt safe, will continue to monitor.

## 2018-10-06 NOTE — ED Provider Notes (Signed)
[facility] DEPT Provider Note   CSN: [id number] Arrival date & time: 10/06/18  1610     History   Chief Complaint Chief Complaint  Patient presents with  . Medical Clearance  . Depression  . Anxiety    HPI Jamie Rivers is a 38 y.o. male.  The history is provided by the patient and medical records. No language interpreter was used.   Jamie Rivers is a 38 y.o. male  with a PMH of anxiety, depression who presents to the Emergency Department complaining of possible verbal hallucinations and depressed mood recently.  Patient states that he has been hearing his housemates say mean things about him such as " Jamie Rivers".  He asked his roommates about this and they said that they did not say these things.  He is frequently hearing them talk about him, but they deny this and say they are not saying them.  He is now concerned that he may be hearing voices.  He denies any visual hallucinations.  He denies any history of similar auditory hallucinations.  He has been compliant with his anxiety and depression medications.  His Zoloft was increased from 100 mg to 150 in the last month or two.  He denies any suicidal thoughts, but does report that he has been very sad lately. No HI.  He saw his doctor who writes his anxiety, depression medications today and they recommended that he come to the ED emergency department for further care.   Past Medical History:  Diagnosis Date  . Anxiety   . Renal disorder 04/22/2016   Renal Cell Carcinoma    Patient Active Problem List   Diagnosis Date Noted  . Neoplasm of left kidney 06/01/2016    Past Surgical History:  Procedure Laterality Date  . bone graft surgery from right hip to support tooth      age 41   . brain tumor surgery      age 51 per patient - benign per patient   . CLEFT PALATE REPAIR    . closure of roof of mouth surgery      age 30   . ROBOTIC ASSITED PARTIAL NEPHRECTOMY Left 06/01/2016   Procedure: XI  ROBOTIC ASSITED PARTIAL NEPHRECTOMY;  Surgeon: Raynelle Bring, MD;  Location: [facility] ORS;  Service: Urology;  Laterality: Left;        Home Medications    Prior to Admission medications   Medication Sig Start Date End Date Taking? Authorizing Provider  Multiple Vitamins-Minerals (MULTIVITAMIN ADULT) TABS Take 1 tablet by mouth daily.   Yes [provider]  naltrexone (DEPADE) 50 MG tablet Take 50 mg by mouth at bedtime. 09/14/18  Yes [provider]  sertraline (ZOLOFT) 100 MG tablet Take 150 mg by mouth daily.  09/14/18  Yes [provider]  traZODone (DESYREL) 50 MG tablet Take 50 mg by mouth at bedtime.  08/14/18  Yes [provider]  HYDROcodone-acetaminophen (NORCO) 5-325 MG tablet Take 1-2 tablets by mouth every 6 (six) hours as needed for moderate pain or severe pain. Patient not taking: Reported on 10/06/2018 06/01/16   Debbrah Alar, PA-C  LORazepam (ATIVAN) 1 MG tablet Take 1 tablet (1 mg total) by mouth 2 (two) times daily. Patient not taking: Reported on 04/02/2016 12/01/13   Maude Leriche, PA-C    Family History Family History  Problem Relation Age of Onset  . Hypertension Mother     Social History Social History   Tobacco Use  .  Smoking status: Never Smoker  . Smokeless tobacco: Never Used  Substance Use Topics  . Alcohol use: Not Currently    Comment: 4-5 nites per week has beer- 2-4 beers per nite   . Drug use: No     Allergies   Patient has no known allergies.   Review of Systems Review of Systems  Psychiatric/Behavioral: Positive for dysphoric mood and hallucinations. Negative for suicidal ideas.  All other systems reviewed and are negative.    Physical Exam Updated Vital Signs BP 117/87 (BP Location: Left Arm)   Pulse 79   Temp 97.8 F (36.6 C) (Oral)   Resp 18   Wt 63.5 kg   SpO2 100%   BMI 21.93 kg/m   Physical Exam Vitals signs and nursing note reviewed.  Constitutional:      General: He is not in acute  distress.    Appearance: He is well-developed.  HENT:     Head: Normocephalic and atraumatic.  Neck:     Musculoskeletal: Neck supple.  Cardiovascular:     Rate and Rhythm: Normal rate and regular rhythm.     Heart sounds: Normal heart sounds. No murmur.  Pulmonary:     Effort: Pulmonary effort is normal. No respiratory distress.     Breath sounds: Normal breath sounds.  Abdominal:     General: There is no distension.     Palpations: Abdomen is soft.     Tenderness: There is no abdominal tenderness.  Skin:    General: Skin is warm and dry.  Neurological:     Mental Status: He is alert and oriented to person, place, and time.  Psychiatric:     Comments: Calm, cooperative and respectful.  Does not appear to be responding to internal stimuli.      ED Treatments / Results  Labs (all labs ordered are listed, but only abnormal results are displayed) Labs Reviewed  COMPREHENSIVE METABOLIC PANEL - Abnormal; Notable for the following components:      Result Value   Glucose, Bld 135 (*)    All other components within normal limits  CBC WITH DIFFERENTIAL/PLATELET  ETHANOL  RAPID URINE DRUG SCREEN, HOSP PERFORMED    EKG None  Radiology No results found.  Procedures Procedures (including critical care time)  Medications Ordered in ED Medications  multivitamin with minerals tablet 1 tablet (1 tablet Oral Given 10/06/18 1908)  naltrexone (DEPADE) tablet 50 mg (has no administration in time range)  sertraline (ZOLOFT) tablet 150 mg (150 mg Oral Not Given 10/06/18 1903)  traZODone (DESYREL) tablet 50 mg (has no administration in time range)  hydrOXYzine (ATARAX/VISTARIL) tablet 25 mg (has no administration in time range)     Initial Impression / Assessment and Plan / ED Course  I have reviewed the triage vital signs and the nursing notes.  Pertinent labs & imaging results that were available during my care of the patient were reviewed by me and considered in my medical  decision making (see chart for details).    Jamie Rivers is a 38 y.o. male who presents to ED for possible auditory hallucinations and worsening depressed mood and anxiety.  He was sent to the emergency department at the recommendation of his outpatient physician.  Afebrile and hemodynamically stable.  Labs reviewed and reassuring.  Medically cleared with disposition per TTS recommendations.  Final Clinical Impressions(s) / ED Diagnoses   Final diagnoses:  Depressed mood    ED Discharge Orders    None  Ward, Ozella Almond, PA-C 10/06/18 Mendel Ryder, MD 10/10/18 [phone]

## 2018-10-06 NOTE — ED Notes (Signed)
PA in to see

## 2018-10-06 NOTE — ED Notes (Signed)
Coming from [facility]-states patient has been on Zoloft and recently had dose increase to 150 mg-since increase patient has been exhibiting signs/sympoms of mania-delusioal-PA want a psych eval and possible admission to [facility] for med adjustments

## 2018-10-06 NOTE — ED Triage Notes (Signed)
Pt sent from MD's [facility] at [facility] for eval.

## 2018-10-07 ENCOUNTER — Inpatient Hospital Stay
Admission: AD | Admit: 2018-10-07 | Discharge: 2018-10-11 | DRG: 885 | Disposition: A | Discharge status: Home / Self Care | Payer: BLUE CROSS/BLUE SHIELD | Source: Intra-hospital | Attending: Psychiatry | Admitting: Psychiatry

## 2018-10-07 DIAGNOSIS — E039 Hypothyroidism, unspecified: Secondary | ICD-10-CM | POA: Diagnosis present

## 2018-10-07 DIAGNOSIS — R45851 Suicidal ideations: Secondary | ICD-10-CM | POA: Diagnosis present

## 2018-10-07 DIAGNOSIS — G4700 Insomnia, unspecified: Secondary | ICD-10-CM | POA: Diagnosis present

## 2018-10-07 DIAGNOSIS — F1021 Alcohol dependence, in remission: Secondary | ICD-10-CM | POA: Diagnosis present

## 2018-10-07 DIAGNOSIS — F419 Anxiety disorder, unspecified: Secondary | ICD-10-CM | POA: Diagnosis present

## 2018-10-07 DIAGNOSIS — Z86011 Personal history of benign neoplasm of the brain: Secondary | ICD-10-CM | POA: Diagnosis not present

## 2018-10-07 DIAGNOSIS — F600 Paranoid personality disorder: Secondary | ICD-10-CM | POA: Diagnosis not present

## 2018-10-07 DIAGNOSIS — R440 Auditory hallucinations: Secondary | ICD-10-CM

## 2018-10-07 DIAGNOSIS — Z811 Family history of alcohol abuse and dependence: Secondary | ICD-10-CM

## 2018-10-07 DIAGNOSIS — Z85528 Personal history of other malignant neoplasm of kidney: Secondary | ICD-10-CM

## 2018-10-07 DIAGNOSIS — Z818 Family history of other mental and behavioral disorders: Secondary | ICD-10-CM | POA: Diagnosis not present

## 2018-10-07 DIAGNOSIS — F23 Brief psychotic disorder: Secondary | ICD-10-CM | POA: Diagnosis not present

## 2018-10-07 DIAGNOSIS — Z79899 Other long term (current) drug therapy: Secondary | ICD-10-CM | POA: Diagnosis not present

## 2018-10-07 DIAGNOSIS — Z87730 Personal history of (corrected) cleft lip and palate: Secondary | ICD-10-CM

## 2018-10-07 DIAGNOSIS — Z905 Acquired absence of kidney: Secondary | ICD-10-CM | POA: Diagnosis not present

## 2018-10-07 DIAGNOSIS — F333 Major depressive disorder, recurrent, severe with psychotic symptoms: Secondary | ICD-10-CM | POA: Diagnosis present

## 2018-10-07 DIAGNOSIS — F429 Obsessive-compulsive disorder, unspecified: Secondary | ICD-10-CM | POA: Diagnosis present

## 2018-10-07 DIAGNOSIS — Z8249 Family history of ischemic heart disease and other diseases of the circulatory system: Secondary | ICD-10-CM

## 2018-10-07 MED ORDER — SERTRALINE HCL 100 MG PO TABS: 100.0000 mg | ORAL_TABLET | Freq: Every day | ORAL | Status: DC

## 2018-10-07 MED ORDER — TRAZODONE HCL 100 MG PO TABS: 100.0000 mg | ORAL_TABLET | Freq: Every evening | ORAL | Status: DC | PRN

## 2018-10-07 MED ORDER — ADULT MULTIVITAMIN W/MINERALS CH: 1.0000 | ORAL_TABLET | Freq: Every day | ORAL | Status: DC

## 2018-10-07 MED ORDER — ALUM & MAG HYDROXIDE-SIMETH 200-200-20 MG/5ML PO SUSP: 30.0000 mL | ORAL | Status: DC | PRN

## 2018-10-07 MED ORDER — NALTREXONE HCL 50 MG PO TABS
50.0000 mg | ORAL_TABLET | Freq: Every day | ORAL | Status: DC
  Filled 2018-10-07: qty 1

## 2018-10-07 MED ORDER — MAGNESIUM HYDROXIDE 400 MG/5ML PO SUSP: 30.0000 mL | Freq: Every day | ORAL | Status: DC | PRN

## 2018-10-07 MED ORDER — HYDROXYZINE HCL 50 MG PO TABS: 50.0000 mg | ORAL_TABLET | Freq: Three times a day (TID) | ORAL | Status: DC | PRN

## 2018-10-07 MED ORDER — ACETAMINOPHEN 325 MG PO TABS: 650.0000 mg | ORAL_TABLET | Freq: Four times a day (QID) | ORAL | Status: DC | PRN

## 2018-10-07 MED ORDER — SERTRALINE HCL 50 MG PO TABS
100.0000 mg | ORAL_TABLET | Freq: Every day | ORAL | Status: DC
  Administered 2018-10-07: 100 mg via ORAL
  Filled 2018-10-07: qty 2

## 2018-10-07 MED ORDER — TRAZODONE HCL 50 MG PO TABS
50.0000 mg | ORAL_TABLET | Freq: Every day | ORAL | Status: DC
  Administered 2018-10-08 – 2018-10-10 (×3): 50 mg via ORAL
  Filled 2018-10-07 (×3): qty 1

## 2018-10-07 MED ORDER — RISPERIDONE 1 MG PO TABS
0.5000 mg | ORAL_TABLET | Freq: Two times a day (BID) | ORAL | Status: DC
  Administered 2018-10-08: 0.5 mg via ORAL
  Filled 2018-10-07: qty 1

## 2018-10-07 MED ORDER — RISPERIDONE 0.5 MG PO TABS
0.5000 mg | ORAL_TABLET | Freq: Two times a day (BID) | ORAL | Status: DC
  Administered 2018-10-07: 0.5 mg via ORAL
  Filled 2018-10-07: qty 1

## 2018-10-07 NOTE — ED Provider Notes (Signed)
Patient has been accepted at [facility] for depression.  Hemodynamically stable and the labs are reassuring.  Abdominal paperwork completed.  Vitals:   10/07/18 1224 10/07/18 1659  BP: 119/81 121/90  Pulse: 79 71  Resp: 18 16  Temp: 97.6 F (36.4 C) 97.9 F (36.6 C)  SpO2: 96% 97%      Varney Biles, MD 10/07/18 2134

## 2018-10-07 NOTE — Consult Note (Signed)
Psychiatry: Chart reviewed.  38 year old man with history of substance abuse and anxiety presented to the emergency room reporting auditory hallucinations.  This appears to be the grounds on which admission to the hospital is being recommended.  Chart reviewed.  Labs reviewed.  Orders completed to allow for admission to our hospital upon transfer.

## 2018-10-07 NOTE — ED Notes (Signed)
Pt reports thinking that other people are talking about him. He is unsure of this reality. He is intelligent and soft-spoken. H/o alcohol use and committed to sobriety.

## 2018-10-07 NOTE — BH Assessment (Signed)
Patient has been accepted to [facility].  Accepting physician is Dr. Weber Cooks.  AttendingPhysician will be Dr. Bary Leriche.  Patient has been assigned to room 312, by [place].  Call report to [phone].  Representative/Transfer Coordinator is Print production planner [facility] TTS). Patient pre-admitted by [facility] Patient Access Elberta Fortis)   [facility] ER Staff Blair Heys, TTS) made aware of acceptance.

## 2018-10-07 NOTE — BH Assessment (Signed)
[facility] Assessment Progress Note  Per Buford Dresser, DO, this pt requires psychiatric hospitalization.  Frederich Cha reports that pt has been accepted to [facility] by Dr Weber Cooks to Rm 312.  Pt has signed Voluntary Admission and Consent for Treatment, as well as Consent to Release Information to Jimmye Norman at [facility], and to [employer] at [facility] in [place], and a notification call has been placed to the provider.  Signed forms have been faxed to [phone].  Pt's nurse, Diane, has been notified, and agrees to call report to [phone].  Pt is to be transported via Stacey Drain, [place] Coordinator [phone]

## 2018-10-07 NOTE — Progress Notes (Signed)
Received Jamie Rivers this PM at the change of shift asleep in his bed.  He woke up to use the bathroom. He signed his voluntary transferred paper work and was transported via [employer] at 2150 hrs with his personal belongings.

## 2018-10-07 NOTE — Consult Note (Addendum)
[facility] Face-to-Face Psychiatry Consult   Reason for Consult:  Psychosis  Referring Physician:  EDP Patient Identification: Jamie Rivers MRN:  [id number] Principal Diagnosis: Brief psychotic disorder ([place]) Diagnosis:  Principal Problem:   Brief psychotic disorder ([place])   Total Time spent with patient: 30 minutes  Subjective:   Jamie Rivers is a 38 y.o. male patient admitted with auditory hallucinations and paranoia.   HPI:  Pt presented to the [facility] at the request of his MD from [place]. [facility] and [facility] seeing a doctor at [facility] for management of his depression. He recently went through an increase of his Zoloft from 100 to 150 mg daily and he stated he has been feeling odd and hearing voices. Pt is pleasant upon approach and smiling but appears to be responding to internal stimuli. Pt stated he lives in an [place] and believes his roommates are talking about him. He stated his mother is in [place] and he is an only child. He stated he has his immigration papers but thinks his roommates are questioning his right to be here. He feels as if they are watching him. Pt's UDS and BAL are negative. Pt is wondering if there is something wrong with his brain from his alcohol abuse or if he is just chemically imbalanced. His Zoloft dose was decreased today to 100 mg daily. He is also on naltrexone 50 mg daily and trazodone 50 mg at bedtime for sleep. Pt is willing to be hospitalized for stabilization.   Past Psychiatric History: As above  Risk to Self: Suicidal Ideation: No-Not Currently/Within Last 6 Months Suicidal Intent: No Is patient at risk for suicide?: No Suicidal Plan?: No Access to Means: No What has been your use of drugs/alcohol within the last 12 months?: sober from alcohol for 5 months How many times?: 0 Other Self Harm Risks: none Triggers for Past Attempts: None known Intentional Self Injurious Behavior: None Risk to  Others: Homicidal Ideation: No Thoughts of Harm to Others: No Current Homicidal Intent: No Current Homicidal Plan: No Access to Homicidal Means: No Identified Victim: none History of harm to others?: No Assessment of Violence: None Noted Violent Behavior Description: none noted Does patient have access to weapons?: No Criminal Charges Pending?: No Does patient have a court date: No Prior Inpatient Therapy: Prior Inpatient Therapy: Yes Prior Therapy Dates: 2019 Prior Therapy Facilty[facility]Provider[facility]): [facility] Reason for Treatment: Alcohol addiction Prior Outpatient Therapy: Prior Outpatient Therapy: Yes Prior Therapy Dates: current Prior Therapy Facilty/Provider(s): [facility] Reason for Treatment: medication management Does patient have an ACCT team?: No Does patient have Intensive In-House Services?  : No Does patient have [facility] services? : No Does patient have P4CC services?: No  Past Medical History:  Past Medical History:  Diagnosis Date  . Anxiety   . 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 bone graft surgery from right hip to support tooth      age 18   . brain tumor surgery      age 70 per patient - benign per patient   . CLEFT PALATE REPAIR    . closure of roof of mouth surgery      age 89   . ROBOTIC ASSITED PARTIAL NEPHRECTOMY Left 06/01/2016   Procedure: XI ROBOTIC ASSITED PARTIAL NEPHRECTOMY;  Surgeon: Raynelle Bring, MD;  Location: [facility];  Service: Urology;  Laterality: Left;  Family History:  Family History  Problem Relation Age of Onset  . Hypertension Mother    Family Psychiatric  History:  Father-alcohol abuse, mother-depression and aunt-schizophrenia.   Social History:  Social History   Substance and Sexual Activity  Alcohol Use Not Currently   Comment: 4-5 nites per week has beer- 2-4 beers per nite      Social History   Substance and Sexual Activity  Drug Use No     Social History   Socioeconomic History  . Marital status: Single    Spouse name: Not on file  . Number of children: Not on file  . Years of education: Not on file  . Highest education level: Not on file  Occupational History  . Not on file  Social Needs  . Financial resource strain: Not on file  . Food insecurity:    Worry: Not on file    Inability: Not on file  . Transportation needs:    Medical: Not on file    Non-medical: Not on file  Tobacco Use  . Smoking status: Never Smoker  . Smokeless tobacco: Never Used  Substance and Sexual Activity  . Alcohol use: Not Currently    Comment: 4-5 nites per week has beer- 2-4 beers per nite   . Drug use: No  . Sexual activity: Not on file  Lifestyle  . Physical activity:    Days per week: Not on file    Minutes per session: Not on file  . Stress: Not on file  Relationships  . Social connections:    Talks on phone: Not on file    Gets together: Not on file    Attends religious service: Not on file    Active member of club or organization: Not on file    Attends meetings of clubs or organizations: Not on file    Relationship status: Not on file  Other Topics Concern  . Not on file  Social History Narrative  . Not on file   Additional Social History: N/A    Allergies:  No Known Allergies  Labs:  Results for orders placed or performed during the hospital encounter of 10/06/18 (from the past 48 hour(s))  Urine rapid drug screen (hosp performed)     Status: None   Collection Time: 10/06/18  6:00 PM  Result Value Ref Range   Opiates NONE DETECTED NONE DETECTED   Cocaine NONE DETECTED NONE DETECTED   Benzodiazepines NONE DETECTED NONE DETECTED   Amphetamines NONE DETECTED NONE DETECTED   Tetrahydrocannabinol NONE DETECTED NONE DETECTED   Barbiturates NONE DETECTED NONE DETECTED    Comment: (NOTE) DRUG SCREEN FOR MEDICAL PURPOSES ONLY.  IF CONFIRMATION IS NEEDED FOR ANY PURPOSE, NOTIFY LAB WITHIN 5 DAYS. LOWEST  DETECTABLE LIMITS FOR URINE DRUG SCREEN Drug Class                     Cutoff (ng/mL) Amphetamine and metabolites    1000 Barbiturate and metabolites    200 Benzodiazepine                 585 Tricyclics and metabolites     300 Opiates and metabolites        300 Cocaine and metabolites        300 THC                            50 Performed at [facility], [place]  [address], [place], [place] [address]   CBC with Differential     Status: None   Collection Time: 10/06/18  6:42 PM  Result Value Ref Range   WBC 9.1 4.0 - 10.5 K/uL   RBC 4.87 4.22 - 5.81 MIL/uL   Hemoglobin 14.0 13.0 - 17.0 g/dL   HCT 42.4 39.0 - 52.0 %   MCV 87.1 80.0 - 100.0 fL   MCH 28.7 26.0 - 34.0 pg   MCHC 33.0 30.0 - 36.0 g/dL   RDW 14.1 11.5 - 15.5 %   Platelets 375 150 - 400 K/uL   nRBC 0.0 0.0 - 0.2 %   Neutrophils Relative % 73 %   Neutro Abs 6.6 1.7 - 7.7 K/uL   Lymphocytes Relative 21 %   Lymphs Abs 1.9 0.7 - 4.0 K/uL   Monocytes Relative 5 %   Monocytes Absolute 0.5 0.1 - 1.0 K/uL   Eosinophils Relative 1 %   Eosinophils Absolute 0.1 0.0 - 0.5 K/uL   Basophils Relative 0 %   Basophils Absolute 0.0 0.0 - 0.1 K/uL   Immature Granulocytes 0 %   Abs Immature Granulocytes 0.04 0.00 - 0.07 K/uL    Comment: Performed at [facility], [place] [address], [place], [place] [address]  Comprehensive metabolic panel     Status: Abnormal   Collection Time: 10/06/18  6:42 PM  Result Value Ref Range   Sodium 137 135 - 145 mmol/L   Potassium 3.8 3.5 - 5.1 mmol/L   Chloride 104 98 - 111 mmol/L   CO2 24 22 - 32 mmol/L   Glucose, Bld 135 (H) 70 - 99 mg/dL   BUN 17 6 - 20 mg/dL   Creatinine, Ser 0.98 0.61 - 1.24 mg/dL   Calcium 9.4 8.9 - 10.3 mg/dL   Total Protein 7.7 6.5 - 8.1 g/dL   Albumin 4.7 3.5 - 5.0 g/dL   AST 21 15 - 41 U/L   ALT 34 0 - 44 U/L   Alkaline Phosphatase 58 38 - 126 U/L   Total Bilirubin 0.7 0.3 - 1.2 mg/dL   GFR calc non Af Amer >60 >60 mL/min   GFR calc  Af Amer >60 >60 mL/min   Anion gap 9 5 - 15    Comment: Performed at [facility], [place] [address], [place], [place] [address]  Ethanol     Status: None   Collection Time: 10/06/18  6:43 PM  Result Value Ref Range   Alcohol, Ethyl (B) <10 <10 mg/dL    Comment: (NOTE) Lowest detectable limit for serum alcohol is 10 mg/dL. For medical purposes only. Performed at [facility], [place] [address], [place], [place] [address]     Current Facility-Administered Medications  Medication Dose Route Frequency Provider Last Rate Last Dose  . hydrOXYzine (ATARAX/VISTARIL) tablet 25 mg  25 mg Oral TID PRN Ethelene Hal, NP      . multivitamin with minerals tablet 1 tablet  1 tablet Oral Daily Ward, Ozella Almond, PA-C   1 tablet at 10/06/18 1908  . naltrexone (DEPADE) tablet 50 mg  50 mg Oral QHS Ward, Ozella Almond, PA-C   50 mg at 10/06/18 2145  . [START ON 10/08/2018] sertraline (ZOLOFT) tablet 100 mg  100 mg Oral Daily Faythe Dingwall, DO      . traZODone (DESYREL) tablet 50 mg  50 mg Oral QHS Ward, Ozella Almond, PA-C   50 mg at 10/06/18 2145   Current Outpatient Medications  Medication Sig Dispense Refill  . Multiple Vitamins-Minerals (MULTIVITAMIN ADULT) TABS Take 1 tablet by mouth daily.    . naltrexone (DEPADE) 50 MG tablet Take 50 mg by mouth at bedtime.    . sertraline (ZOLOFT) 100 MG tablet Take 150 mg by mouth daily.     . traZODone (DESYREL) 50 MG tablet Take 50 mg by mouth at bedtime.     Marland Kitchen HYDROcodone-acetaminophen (NORCO) 5-325 MG tablet Take 1-2 tablets by mouth every 6 (six) hours as needed for moderate pain or severe pain. (Patient not taking: Reported on 10/06/2018) 30 tablet 0  . LORazepam (ATIVAN) 1 MG tablet Take 1 tablet (1 mg total) by mouth 2 (two) times daily. (Patient not taking: Reported on 04/02/2016) 10 tablet 0    Musculoskeletal: Strength & Muscle Tone: within normal limits Gait & Station: normal Patient leans:  N/A  Psychiatric Specialty Exam: Physical Exam  Nursing note and vitals reviewed. Constitutional: He is oriented to person, place, and time. He appears well-developed and well-nourished.  HENT:  Head: Normocephalic and atraumatic.  Neck: Normal range of motion.  Respiratory: Effort normal.  Musculoskeletal: Normal range of motion.  Neurological: He is alert and oriented to person, place, and time.  Psychiatric: Judgment normal. His mood appears anxious. His speech is tangential. He is actively hallucinating. Thought content is paranoid and delusional. Cognition and memory are impaired. He exhibits a depressed mood.    Review of Systems  Psychiatric/Behavioral: Positive for hallucinations (AH). Negative for substance abuse and suicidal ideas.  All other systems reviewed and are negative.   Blood pressure 119/81, pulse 79, temperature 97.6 F (36.4 C), temperature source Oral, resp. rate 18, weight 63.5 kg, SpO2 96 %.Body mass index is 21.93 kg/m.  General Appearance: Casual  Eye Contact:  Fair  Speech:  Clear and Coherent and Normal Rate  Volume:  Normal  Mood:  Anxious and Depressed  Affect:  Congruent and Depressed  Thought Process:  Coherent, Linear and Descriptions of Associations: Loose  Orientation:  Full (Time, Place, and Person)  Thought Content:  Logical  Suicidal Thoughts:  No  Homicidal Thoughts:  No  Memory:  Immediate;   Good Recent;   Good Remote;   Fair  Judgement:  Poor  Insight:  Shallow  Psychomotor Activity:  Normal  Concentration:  Concentration: Fair and Attention Span: Fair  Recall:  [employer] of Knowledge:  Good  Language:  Good  Akathisia:  No  Handed:  Right  AIMS (if indicated):   N/A  Assets:  Communication Skills Desire for Improvement Financial Resources/Insurance Housing Social Support Vocational/Educational  ADL's:  Intact  Cognition:  WNL  Sleep:   N/A     Treatment Plan Summary: Daily contact with patient to assess and  evaluate symptoms and progress in treatment and Medication management (see MAR) -Start Risperdal 0.5 mg BID for psychosis.   Disposition: Recommend psychiatric Inpatient admission when medically cleared. TTS to seek placement  Ethelene Hal, NP 10/07/2018 1:06 PM   Patient seen face-to-face for psychiatric evaluation, chart reviewed and case discussed with the physician extender and developed treatment plan. Reviewed the information documented and agree with the treatment plan.  Buford Dresser, DO 10/07/18 3:19 PM

## 2018-10-07 NOTE — ED Notes (Signed)
Attempted to call report to [place] with [facility] RN who requested that night nurse call report and transport pt because of staffing at [facility].

## 2018-10-08 ENCOUNTER — Other Ambulatory Visit: Payer: Self-pay

## 2018-10-08 ENCOUNTER — Encounter: Payer: Self-pay | Admitting: Psychiatry

## 2018-10-08 DIAGNOSIS — F333 Major depressive disorder, recurrent, severe with psychotic symptoms: Secondary | ICD-10-CM | POA: Diagnosis present

## 2018-10-08 DIAGNOSIS — E039 Hypothyroidism, unspecified: Secondary | ICD-10-CM | POA: Diagnosis present

## 2018-10-08 DIAGNOSIS — F429 Obsessive-compulsive disorder, unspecified: Secondary | ICD-10-CM | POA: Diagnosis present

## 2018-10-08 DIAGNOSIS — F23 Brief psychotic disorder: Secondary | ICD-10-CM

## 2018-10-08 LAB — HEMOGLOBIN A1C
Hgb A1c MFr Bld: 5.6 % (ref 4.8–5.6)
Mean Plasma Glucose: 114.02 mg/dL

## 2018-10-08 LAB — TSH: TSH: 6.847 u[IU]/mL — AB (ref 0.350–4.500)

## 2018-10-08 LAB — LIPID PANEL
Cholesterol: 230 mg/dL — ABNORMAL HIGH (ref 0–200)
HDL: 44 mg/dL (ref >40)
LDL Cholesterol: 158 mg/dL — ABNORMAL HIGH (ref 0–99)
Total CHOL/HDL Ratio: 5.2 RATIO
Triglycerides: 138 mg/dL (ref <150)
VLDL: 28 mg/dL (ref 0–40)

## 2018-10-08 LAB — T4, FREE: Free T4: 0.79 ng/dL — ABNORMAL LOW (ref 0.82–1.77)

## 2018-10-08 MED ORDER — FLUVOXAMINE MALEATE 50 MG PO TABS
50.0000 mg | ORAL_TABLET | Freq: Every day | ORAL | Status: DC
  Administered 2018-10-08 – 2018-10-09 (×2): 50 mg via ORAL
  Filled 2018-10-08 (×2): qty 1

## 2018-10-08 MED ORDER — LEVOTHYROXINE SODIUM 50 MCG PO TABS
50.0000 ug | ORAL_TABLET | Freq: Every day | ORAL | Status: DC
  Administered 2018-10-09 – 2018-10-11 (×3): 50 ug via ORAL
  Filled 2018-10-08 (×3): qty 1

## 2018-10-08 MED ORDER — RISPERIDONE 1 MG PO TABS
1.0000 mg | ORAL_TABLET | Freq: Two times a day (BID) | ORAL | Status: DC
  Administered 2018-10-08 – 2018-10-11 (×6): 1 mg via ORAL
  Filled 2018-10-08 (×6): qty 1

## 2018-10-08 NOTE — Plan of Care (Signed)
  Problem: Education: Goal: Knowledge of the prescribed therapeutic regimen will improve Outcome: Progressing   Problem: Coping: Goal: Coping ability will improve Outcome: Progressing Goal: Will verbalize feelings Outcome: Progressing   Problem: Health Behavior/Discharge Planning: Goal: Compliance with prescribed medication regimen will improve Outcome: Progressing   Problem: Activity: Goal: Will verbalize the importance of balancing activity with adequate rest periods Outcome: Not Progressing   Problem: Education: Goal: Will be free of psychotic symptoms Outcome: Not Progressing

## 2018-10-08 NOTE — BHH Suicide Risk Assessment (Addendum)
[facility] Admission Suicide Risk Assessment   Nursing information obtained from:  Patient Demographic factors:  Male, Low socioeconomic status, Adolescent or young adult Current Mental Status:  NA Loss Factors:  NA Historical Factors:  NA Risk Reduction Factors:  Positive coping skills or problem solving skills, Religious beliefs about death, Living with another person, especially a relative  Total Time spent with patient: 1 hour Principal Problem: Severe recurrent major depressive disorder with psychotic features ([place]) Diagnosis:  Principal Problem:   Severe recurrent major depressive disorder with psychotic features ([place]) Active Problems:   Hypothyroidism   OCD (obsessive compulsive disorder)  Subjective Data: suicidal ideation  Continued Clinical Symptoms:  Alcohol Use Disorder Identification Test Final Score (AUDIT): 0 The "Alcohol Use Disorders Identification Test", Guidelines for Use in Primary Care, Second Edition.  World Pharmacologist [facility]). Score between 0-7:  no or low risk or alcohol related problems. Score between 8-15:  moderate risk of alcohol related problems. Score between 16-19:  high risk of alcohol related problems. Score 20 or above:  warrants further diagnostic evaluation for alcohol dependence and treatment.   CLINICAL FACTORS:   Severe Anxiety and/or Agitation Depression:   Impulsivity Insomnia Currently Psychotic Medical Diagnoses and Treatments/Surgeries   Musculoskeletal: Strength & Muscle Tone: within normal limits Gait & Station: normal Patient leans: N/A  Psychiatric Specialty Exam: Physical Exam  Nursing note and vitals reviewed. Psychiatric: His affect is blunt. His speech is delayed and tangential. He is slowed, withdrawn and actively hallucinating. Thought content is paranoid and delusional. Cognition and memory are normal. He expresses impulsivity. He exhibits a depressed mood. He expresses suicidal ideation.    Review of Systems   Neurological: Negative.   Psychiatric/Behavioral: Positive for depression, hallucinations and suicidal ideas. The patient is nervous/anxious and has insomnia.   All other systems reviewed and are negative.   Pulse 82, temperature 98.2 F (36.8 C), temperature source Oral.There is no height or weight on file to calculate BMI.  General Appearance: Casual  Eye Contact:  Good  Speech:  Slow  Volume:  Decreased  Mood:  Anxious and Depressed  Affect:  Blunt  Thought Process:  Goal Directed and Descriptions of Associations: Tangential  Orientation:  Full (Time, Place, and Person)  Thought Content:  Delusions, Hallucinations: Auditory and Paranoid Ideation  Suicidal Thoughts:  Yes.  without intent/plan  Homicidal Thoughts:  No  Memory:  Immediate;   Fair Recent;   Fair Remote;   Fair  Judgement:  Poor  Insight:  Shallow  Psychomotor Activity:  Psychomotor Retardation  Concentration:  Concentration: Fair and Attention Span: Fair  Recall:  [employer] of Knowledge:  Fair  Language:  Fair  Akathisia:  No  Handed:  Right  AIMS (if indicated):     Assets:  Communication Skills Desire for Improvement Financial Resources/Insurance [place] Talents/Skills Transportation Vocational/Educational  ADL's:  Intact  Cognition:  WNL  Sleep:  Number of Hours: 6      COGNITIVE FEATURES THAT CONTRIBUTE TO RISK:  None    SUICIDE RISK:   Mild:  Suicidal ideation of limited frequency, intensity, duration, and specificity.  There are no identifiable plans, no associated intent, mild dysphoria and related symptoms, good self-control (both objective and subjective assessment), few other risk factors, and identifiable protective factors, including available and accessible social support.  PLAN OF CARE: hospital admission, medication management, substance abuse counseling, discharge planning.  Jamie Rivers is a 38 year old male with a history of depression,  anxiety  and alcoholism in early remission admitted for worsening depression, suicidal ideation, paranoia and hallucinations.  #Suicidal ideation -patient is able to contract for safety in the hospital  #Mood/psychosis -discontinue Zoloft -start Luvox 50 mg nightly -increase Risperdal to 1 mg BID -Trazodone 50 mg nightly  #Alcoholism -discontinue Naltrexon per patient reqest  #Labs -lipid panel, TSH, A1C -EKG  #Disposition -discharge back to [facility] -follow up with [facility]  I certify that inpatient services furnished can reasonably be expected to improve the patient's condition.   Orson Slick, MD 10/09/2018, 12:22 PM

## 2018-10-08 NOTE — BHH Group Notes (Signed)
[place] Group Notes:  (Nursing/MHT/Case Management/Adjunct)  Date:  10/08/2018  Time:  9:36 PM  Type of Therapy:  Group Therapy  Participation Level:  Active  Participation Quality:  Appropriate  Affect:  Appropriate  Cognitive:  Appropriate  Insight:  Appropriate  Engagement in Group:  Engaged  Modes of Intervention:  Discussion  Summary of Progress/Problems: Jamie Rivers stated his goal was to get settled in on this day. Jamie Rivers stated he was able to accomplish his goal for the day. Jamie Rivers 10/08/2018, 9:36 PM

## 2018-10-08 NOTE — H&P (Addendum)
Psychiatric Admission Assessment Adult  Patient Identification: Jamie Rivers MRN:  [id number] Date of Evaluation:  10/09/2018 Chief Complaint:  Severe recurrent major depressive disorder with psychotic features  Principal Diagnosis: Severe recurrent major depressive disorder with psychotic features ([place]) Diagnosis:  Principal Problem:   Severe recurrent major depressive disorder with psychotic features ([place]) Active Problems:   Hypothyroidism   OCD (obsessive compulsive disorder)  History of Present Illness:   Identifying data. Jamie Rivers is a 38 year old male with a history of depression.  Chief complaint. "A lot of pressure."  History of present illness. Information was obtained from the patient and the chart. The patient came tyo [facility] ER complaining of new onset of suicidal thinking, hallucinations and paranoia. He was in rehab at the [place] [facility] last fall and moved into [facility]. He was started on Zoloft and Naltrexon and his Zoloft was recently increased. The patient believes that Naltrexon "messes him up" and wants to stop it, at least temporary. He sees no benefits from Zoloft. He reports that he worries that his roommates are conspiring with his mother and talk behind his back. Even when home alone, he can hear their voices. He feels his depression has been worsening over time but suicidal thinking is new and frightening. He reports poor sleep and appetite, anhedonia, feeling of guilt hopelessness worthlessness, poor energy and concentration, social isolation. He feels he is constantly judged and under appreciated. He feels that his roommates turn against him as he is "multiracial" which was discovered through commercial DNA testing. His family has been accusing him of putting them off since he developed intense preoccupation with Jewish faith. He reports severe anxiety with social anxiety "all his life", infrequent panic attacks, and OCD. He worries, ruminates and organizes a  lot. He suspects that he is on autism spectrum.  Since last fall, he has not been using alcohol or other substances.  Past psychiatric history. Never hospitalized. No suicide attempts. He is on the Zoloft, Trazodone and Naltrexone. Went to rehab at the [facility] once.  Family psychiatric history,. Depression, anxiety, alcoholism, Schizophrenia and suicide on his mother's side.  Social history. 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 Lost his one bedroom apartment and still owes them money. Lives at the [facility], increasingly dissatisfied with his arrangements. Since childhood he has had brain tumor removed and partial nephrectomy for kidney cancer.  Total Time spent with patient: 1 hour  Is the patient at risk to self? Yes.    Has the patient been a risk to self in the past 6 months? No.  Has the patient been a risk to self within the distant past? No.  Is the patient a risk to others? No.  Has the patient been a risk to others in the past 6 months? No.  Has the patient been a risk to others within the distant past? No.   Prior Inpatient Therapy:   Prior Outpatient Therapy:    Alcohol Screening: 1. How often do you have a drink containing alcohol?: Never 2. How many drinks containing alcohol do you have on a typical day when you are drinking?: 1 or 2 3. How often do you have six or more drinks on one occasion?: Never AUDIT-C Score: 0 4. How often during the last year have you found that you were not able to stop drinking once you had started?: Never 5. How often during the last year have you failed  to do what was normally expected from you becasue of drinking?: Never 6. How often during the last year have you needed a first drink in the morning to get yourself going after a heavy drinking session?: Never 7. How often during the last year have you had a feeling of guilt of remorse after drinking?: Never 8. How often  during the last year have you been unable to remember what happened the night before because you had been drinking?: Never 9. Have you or someone else been injured as a result of your drinking?: No 10. Has a relative or friend or a doctor or another health worker been concerned about your drinking or suggested you cut down?: No Alcohol Use Disorder Identification Test Final Score (AUDIT): 0 Substance Abuse History in the last 12 months:  Yes.   Consequences of Substance Abuse: Negative Previous Psychotropic Medications: Yes  Psychological Evaluations: No  Past Medical History:  Past Medical History:  Diagnosis Date  . Anxiety   . 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 bone graft surgery from right hip to support tooth      age 84   . brain tumor surgery      age 52 per patient - benign per patient   . CLEFT PALATE REPAIR    . closure of roof of mouth surgery      age 29   . ROBOTIC ASSITED PARTIAL NEPHRECTOMY Left 06/01/2016   Procedure: XI ROBOTIC ASSITED PARTIAL NEPHRECTOMY;  Surgeon: Raynelle Bring, MD;  Location: [facility];  Service: Urology;  Laterality: Left;   Family History:  Family History  Problem Relation Age of Onset  . Hypertension Mother    Tobacco Screening: Have you used any form of tobacco in the last 30 days? (Cigarettes, Smokeless Tobacco, Cigars, and/or Pipes): No Social History:  Social History   Substance and Sexual Activity  Alcohol Use Not Currently   Comment: 4-5 nites per week has beer- 2-4 beers per nite      Social History   Substance and Sexual Activity  Drug Use No    Additional Social History: Marital status: Single Are you sexually active?: No What is your sexual orientation?: heterosexual Has your sexual activity been affected by drugs, alcohol, medication, or emotional stress?: no Does patient have children?: No                         Allergies:  No Known  Allergies Lab Results:  Results for orders placed or performed during the hospital encounter of 10/07/18 (from the past 48 hour(s))  Hemoglobin A1c     Status: None   Collection Time: 10/08/18  6:43 AM  Result Value Ref Range   Hgb A1c MFr Bld 5.6 4.8 - 5.6 %    Comment: (NOTE) Pre diabetes:          5.7%-6.4% Diabetes:              >6.4% Glycemic control for   <7.0% adults with diabetes    Mean Plasma Glucose 114.02 mg/dL    Comment: Performed at [facility], [place] [address], [place], [place] [address]  Lipid panel     Status: Abnormal   Collection Time: 10/08/18  6:43 AM  Result Value Ref Range   Cholesterol 230 (H) 0 - 200 mg/dL   Triglycerides 138 <150 mg/dL   HDL 44 >40 mg/dL  Total CHOL/HDL Ratio 5.2 RATIO   VLDL 28 0 - 40 mg/dL   LDL Cholesterol 158 (H) 0 - 99 mg/dL    Comment:        Total Cholesterol/HDL:CHD Risk Coronary Heart Disease Risk Table                     Men   Women  1/2 Average Risk   3.4   3.3  Average Risk       5.0   4.4  2 X Average Risk   9.6   7.1  3 X Average Risk  23.4   11.0        Use the calculated Patient Ratio above and the CHD Risk Table to determine the patient's CHD Risk.        ATP III CLASSIFICATION (LDL):  <100     mg/dL   Optimal  100-129  mg/dL   Near or Above                    Optimal  130-159  mg/dL   Borderline  160-189  mg/dL   High  >190     mg/dL   Very High Performed at [facility], [address] [place], [place] [address]   TSH     Status: Abnormal   Collection Time: 10/08/18  6:43 AM  Result Value Ref Range   TSH 6.847 (H) 0.350 - 4.500 uIU/mL    Comment: Performed by a 3rd Generation assay with a functional sensitivity of <=0.01 uIU/mL. Performed at [facility], [address] [place], [place] [address]   T4, free     Status: Abnormal   Collection Time: 10/08/18  6:43 AM  Result Value Ref Range   Free T4 0.79 (L) 0.82 - 1.77 ng/dL    Comment: (NOTE) Biotin ingestion may  interfere with free T4 tests. If the results are inconsistent with the TSH level, previous test results, or the clinical presentation, then consider biotin interference. If needed, order repeat testing after stopping biotin. Performed at [facility], [address] [place],  [address]     Blood Alcohol level:  Lab Results  Component Value Date   [facility] <10 91/47/8295    Metabolic Disorder Labs:  Lab Results  Component Value Date   HGBA1C 5.6 10/08/2018   MPG 114.02 10/08/2018   No results found for: PROLACTIN Lab Results  Component Value Date   CHOL 230 (H) 10/08/2018   TRIG 138 10/08/2018   HDL 44 10/08/2018   CHOLHDL 5.2 10/08/2018   VLDL 28 10/08/2018   LDLCALC 158 (H) 10/08/2018    Current Medications: Current Facility-Administered Medications  Medication Dose Route Frequency Provider Last Rate Last Dose  . acetaminophen (TYLENOL) tablet 650 mg  650 mg Oral Q6H PRN Clapacs, [name], MD      . alum & mag hydroxide-simeth (MAALOX/MYLANTA) 200-200-20 MG/5ML suspension 30 mL  30 mL Oral Q4H PRN Clapacs, [name], MD      . fluvoxaMINE (LUVOX) tablet 50 mg  50 mg Oral QHS Verlyn Dannenberg B, MD   50 mg at 10/08/18 2122  . hydrOXYzine (ATARAX/VISTARIL) tablet 50 mg  50 mg Oral TID PRN Clapacs, [name], MD      . levothyroxine (SYNTHROID, LEVOTHROID) tablet 50 mcg  50 mcg Oral Q0600 Anwar Crill B, MD   50 mcg at 10/09/18 0610  . magnesium hydroxide (MILK OF MAGNESIA) suspension 30 mL  30  mL Oral Daily PRN Clapacs, [name], MD      . risperiDONE (RISPERDAL) tablet 1 mg  1 mg Oral BID Ulani Degrasse B, MD   1 mg at 10/09/18 0826  . traZODone (DESYREL) tablet 100 mg  100 mg Oral QHS PRN Clapacs, [name], MD      . traZODone (DESYREL) tablet 50 mg  50 mg Oral QHS Clapacs, [name], MD   50 mg at 10/08/18 2125   PTA Medications: Medications Prior to Admission  Medication Sig Dispense Refill Last Dose  . HYDROcodone-acetaminophen (NORCO) 5-325 MG tablet  Take 1-2 tablets by mouth every 6 (six) hours as needed for moderate pain or severe pain. (Patient not taking: Reported on 10/06/2018) 30 tablet 0 Completed Course at Unknown time  . LORazepam (ATIVAN) 1 MG tablet Take 1 tablet (1 mg total) by mouth 2 (two) times daily. (Patient not taking: Reported on 04/02/2016) 10 tablet 0 Completed Course at Unknown time  . Multiple Vitamins-Minerals (MULTIVITAMIN ADULT) TABS Take 1 tablet by mouth daily.   10/05/2018 at Unknown time  . naltrexone (DEPADE) 50 MG tablet Take 50 mg by mouth at bedtime.   10/05/2018 at Unknown time  . sertraline (ZOLOFT) 100 MG tablet Take 150 mg by mouth daily.    10/06/2018 at Unknown time  . traZODone (DESYREL) 50 MG tablet Take 50 mg by mouth at bedtime.    10/05/2018 at Unknown time    Musculoskeletal: Strength & Muscle Tone: within normal limits Gait & Station: normal Patient leans: N/A  Psychiatric Specialty Exam: Physical Exam  Nursing note and vitals reviewed. Constitutional: He is oriented to person, place, and time. He appears well-developed and well-nourished.  HENT:  Head: Normocephalic and atraumatic.  Eyes: Pupils are equal, round, and reactive to light. Conjunctivae and EOM are normal.  Neck: Normal range of motion. Neck supple.  Cardiovascular: Normal rate and regular rhythm.  Respiratory: Effort normal and breath sounds normal.  GI: Soft.  Musculoskeletal: Normal range of motion.  Neurological: He is alert and oriented to person, place, and time.  Skin: Skin is warm and dry.  Psychiatric: His mood appears anxious. His affect is blunt. His speech is delayed and tangential. He is slowed, withdrawn and actively hallucinating. Thought content is paranoid and delusional. Cognition and memory are normal. He expresses impulsivity. He exhibits a depressed mood. He expresses suicidal ideation.    Review of Systems  Neurological: Negative.   Psychiatric/Behavioral: Positive for depression, hallucinations and  suicidal ideas. The patient is nervous/anxious and has insomnia.   All other systems reviewed and are negative.   Pulse 82, temperature 98.2 F (36.8 C), temperature source Oral.There is no height or weight on file to calculate BMI.  See SRA                                                  Sleep:  Number of Hours: 6    Treatment Plan Summary: Daily contact with patient to assess and evaluate symptoms and progress in treatment and Medication management   Mr. 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  #Suicidal ideation -patient is able to contract for safety in the hospital  #Mood/psychosis -discontinue Zoloft -start Luvox 50 mg nightly -increase Risperdal to  1 mg BID -Trazodone 50 mg nightly  #Alcoholism -discontinue Naltrexon per patient request  #Hypothyroidism -elevated TSH and low free T4 -start Synthroid 50 mcg  #Labs -lipid panel, TSH, A1C -EKG reviewed, NSR with QTc 419  #Disposition -discharge back to [facility] -follow up with [facility]   Observation Level/Precautions:  15 minute checks  Laboratory:  CBC Chemistry Profile UDS UA  Psychotherapy:    Medications:    Consultations:    Discharge Concerns:    Estimated LOS:  Other:     Physician Treatment Plan for Primary Diagnosis: Severe recurrent major depressive disorder with psychotic features ([place]) Long Term Goal(s): Improvement in symptoms so as ready for discharge  Short Term Goals: Ability to identify changes in lifestyle to reduce recurrence of condition will improve, Ability to verbalize feelings will improve, Ability to disclose and discuss suicidal ideas, Ability to demonstrate self-control will improve, Ability to identify and develop effective coping behaviors will improve, Ability to maintain clinical measurements within  normal limits will improve and Ability to identify triggers associated with substance abuse/mental health issues will improve  Physician Treatment Plan for Secondary Diagnosis: Principal Problem:   Severe recurrent major depressive disorder with psychotic features ([place]) Active Problems:   Hypothyroidism   OCD (obsessive compulsive disorder)  Long Term Goal(s): Improvement in symptoms so as ready for discharge  Short Term Goals: Ability to identify changes in lifestyle to reduce recurrence of condition will improve, Ability to demonstrate self-control will improve and Ability to identify triggers associated with substance abuse/mental health issues will improve  I certify that inpatient services furnished can reasonably be expected to improve the patient's condition.    Orson Slick, MD 2/2/202012:22 PM

## 2018-10-08 NOTE — [facility] Group Notes (Signed)
LCSW Group Therapy Note   10/08/2018 1:15pm   Type of Therapy and Topic:  Group Therapy:  Trust and Honesty  Participation Level:  Did Not Attend  Description of Group:    In this group patients will be asked to explore the value of being honest.  Patients will be guided to discuss their thoughts, feelings, and behaviors related to honesty and trusting in others. Patients will process together how trust and honesty relate to forming relationships with peers, family members, and self. Each patient will be challenged to identify and express feelings of being vulnerable. Patients will discuss reasons why people are dishonest and identify alternative outcomes if one was truthful (to self or others). This group will be process-oriented, with patients participating in exploration of their own experiences, giving and receiving support, and processing challenge from other group members.   Therapeutic Goals: 1. Patient will identify why honesty is important to relationships and how honesty overall affects relationships.  2. Patient will identify a situation where they lied or were lied too and the  feelings, thought process, and behaviors surrounding the situation 3. Patient will identify the meaning of being vulnerable, how that feels, and how that correlates to being honest with self and others. 4. Patient will identify situations where they could have told the truth, but instead lied and explain reasons of dishonesty.   Summary of Patient ProgressPt was invited to attend group but chose not to attend. CSW will continue to encourage pt to attend group throughout their admission.     Therapeutic Modalities:   Cognitive Behavioral Therapy Solution Focused Therapy Motivational Interviewing Brief Therapy  Nakeem Murnane  CUEBAS-COLON, LCSW 10/08/2018 3:26 PM

## 2018-10-08 NOTE — Tx Team (Signed)
Initial Treatment Plan 10/08/2018 4:45 AM Jamie Rivers MLY:[id number]    PATIENT STRESSORS: Medication change or noncompliance Substance abuse   PATIENT STRENGTHS: Active sense of humor General fund of knowledge Motivation for treatment/growth   PATIENT IDENTIFIED PROBLEMS: Psychosis                      DISCHARGE CRITERIA:  Improved stabilization in mood, thinking, and/or behavior Motivation to continue treatment in a less acute level of care  PRELIMINARY DISCHARGE PLAN: Outpatient therapy  PATIENT/FAMILY INVOLVEMENT: This treatment plan has been presented to and reviewed with the patient, Jamie Rivers,  The patient and family have been given the opportunity to ask questions and make suggestions.  Harl Bowie, RN 10/08/2018, 4:45 AM

## 2018-10-08 NOTE — Progress Notes (Signed)
Patient pleasant and cooperative. Medication compliant. Appropriate with staff and peers. Denies SI, HI. Endorses auditory hallucinations.  Encouragement and support offered, safety checks maintained. Pt receptive and remains safe on unit with q 15 min checks.

## 2018-10-09 NOTE — Plan of Care (Signed)
Patient verbalizes understanding of the general information/prescribed therapeutic regimen that's been provided to him and has been in compliance. Patient has been present in the milieu and has attended/participated in unit activities without any issues at this time. Patient rated his depression and anxiety a "2/10" stating that it's "not as much, it's why I'm here, my mom is resentful of me". Patient denies SI, HI, AVH to this Probation officer. Patient has demonstrated self-control on the unit and has not had any issues to report. Patient has been free from injury and remains safe on the unit at this time.  Problem: Activity: Goal: Will verbalize the importance of balancing activity with adequate rest periods Outcome: Progressing   Problem: Education: Goal: Will be free of psychotic symptoms Outcome: Progressing Goal: Knowledge of the prescribed therapeutic regimen will improve Outcome: Progressing   Problem: Coping: Goal: Coping ability will improve Outcome: Progressing Goal: Will verbalize feelings Outcome: Progressing   Problem: Health Behavior/Discharge Planning: Goal: Compliance with prescribed medication regimen will improve Outcome: Progressing   Problem: Education: Goal: Utilization of techniques to improve thought processes will improve Outcome: Progressing Goal: Knowledge of the prescribed therapeutic regimen will improve Outcome: Progressing   Problem: Activity: Goal: Interest or engagement in leisure activities will improve Outcome: Progressing Goal: Imbalance in normal sleep/wake cycle will improve Outcome: Progressing   Problem: Coping: Goal: Coping ability will improve Outcome: Progressing Goal: Will verbalize feelings Outcome: Progressing   Problem: Role Relationship: Goal: Will demonstrate positive changes in social behaviors and relationships Outcome: Progressing   Problem: Safety: Goal: Ability to disclose and discuss suicidal ideas will improve Outcome:  Progressing Goal: Ability to identify and utilize support systems that promote safety will improve Outcome: Progressing   Problem: Education: Goal: Knowledge of [place] [facility] Education information/materials will improve Outcome: Progressing Goal: Emotional status will improve Outcome: Progressing Goal: Mental status will improve Outcome: Progressing Goal: Verbalization of understanding the information provided will improve Outcome: Progressing   Problem: Activity: Goal: Interest or engagement in activities will improve Outcome: Progressing Goal: Sleeping patterns will improve Outcome: Progressing   Problem: Coping: Goal: Ability to verbalize frustrations and anger appropriately will improve Outcome: Progressing Goal: Ability to demonstrate self-control will improve Outcome: Progressing   Problem: Health Behavior/Discharge Planning: Goal: Identification of resources available to assist in meeting health care needs will improve Outcome: Progressing Goal: Compliance with treatment plan for underlying cause of condition will improve Outcome: Progressing   Problem: Physical Regulation: Goal: Ability to maintain clinical measurements within normal limits will improve Outcome: Progressing   Problem: Safety: Goal: Periods of time without injury will increase Outcome: Progressing

## 2018-10-09 NOTE — [facility] Group Notes (Signed)
LCSW Group Therapy Note 10/09/2018 1:15pm  Type of Therapy and Topic: Group Therapy: Feelings Around Returning Home & Establishing a Supportive Framework and Supporting Oneself When Supports Not Available  Participation Level: Active  Description of Group:  Patients first processed thoughts and feelings about upcoming discharge. These included fears of upcoming changes, lack of change, new living environments, judgements and expectations from others and overall stigma of mental health issues. The group then discussed the definition of a supportive framework, what that looks and feels like, and how do to discern it from an unhealthy non-supportive network. The group identified different types of supports as well as what to do when your family/friends are less than helpful or unavailable  Therapeutic Goals  1. Patient will identify one healthy supportive network that they can use at discharge. 2. Patient will identify one factor of a supportive framework and how to tell it from an unhealthy network. 3. Patient able to identify one coping skill to use when they do not have positive supports from others. 4. Patient will demonstrate ability to communicate their needs through discussion and/or role plays.  Summary of Patient Progress:  The patient reported he feels "hopefull."Pt engaged  during group session. As patients processed their anxiety about discharge and described healthy supports patient shared he is not rready to be dischard. Patients identified at least one self-care tool they were willing to use after discharge.   Therapeutic Modalities Cognitive Behavioral Therapy Motivational Interviewing   Elonzo Sopp  CUEBAS-COLON, LCSW 10/09/2018 9:44 AM

## 2018-10-09 NOTE — Plan of Care (Signed)
  Pt denies suicide/homicidal ideation.  Compliance with medications.  Pt reports feeling hopeful and coping well.  Problem: Health Behavior/Discharge Planning: Goal: Compliance with prescribed medication regimen will improve Outcome: Progressing   Problem: Education: Goal: Utilization of techniques to improve thought processes will improve Outcome: Progressing   Problem: Coping: Goal: Coping ability will improve Outcome: Progressing

## 2018-10-09 NOTE — Progress Notes (Signed)
[facility] MD Progress Note  10/09/2018 12:21 PM Jamie Rivers  MRN:  [id number]  Subjective:    Mr. Jamie Rivers reports feeling better but he is rather hopeless. He still has suicidal ideation but is able to contract for safety in the hospital. He no longer hallucinates but is still very paranoid. He keeps ruminating on his situation at the [facility] and worries that his housemates will vote him out as they think that he is a "horrible person". It is so because they do not believe that he is Jamie Rivers and that he rejects his Jamie Rivers. He believes that they talked to his mother, sister and brother who told them that he is a Jamie Rivers. He is hardly able to talk about anything else today. He took medications lat night and reports no side effects. Sleep and appetite good. He has been secluded to his room most of the time but does go to groups.   Principal Problem: Severe recurrent major depressive disorder with psychotic features ([place]) Diagnosis: Principal Problem:   Severe recurrent major depressive disorder with psychotic features ([place]) Active Problems:   Hypothyroidism   OCD (obsessive compulsive disorder)  Total Time spent with patient: 20 minutes  Past Psychiatric History: depression, alcoholism  Past Medical History:  Past Medical History:  Diagnosis Date  . Anxiety   . 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 bone graft surgery from right hip to support tooth      age 67   . brain tumor surgery      age 56 per patient - benign per patient   . CLEFT PALATE REPAIR    . closure of roof of mouth surgery      age 33   . ROBOTIC ASSITED PARTIAL NEPHRECTOMY Left 06/01/2016   Procedure: XI ROBOTIC ASSITED PARTIAL NEPHRECTOMY;  Surgeon: Raynelle Bring, MD;  Location: [facility];  Service: Urology;  Laterality: Left;   Family History:  Family History  Problem Relation Age of Onset  . Hypertension Mother    Family Psychiatric   History: depression Social History:  Social History   Substance and Sexual Activity  Alcohol Use Not Currently   Comment: 4-5 nites per week has beer- 2-4 beers per nite      Social History   Substance and Sexual Activity  Drug Use No    Social History   Socioeconomic History  . Marital status: Single    Spouse name: Not on file  . Number of children: Not on file  . Years of education: Not on file  . Highest education level: Not on file  Occupational History  . Not on file  Social Needs  . Financial resource strain: Not on file  . Food insecurity:    Worry: Not on file    Inability: Not on file  . Transportation needs:    Medical: Not on file    Non-medical: Not on file  Tobacco Use  . Smoking status: Never Smoker  . Smokeless tobacco: Never Used  Substance and Sexual Activity  . Alcohol use: Not Currently    Comment: 4-5 nites per week has beer- 2-4 beers per nite   . Drug use: No  . Sexual activity: Not Currently  Lifestyle  . Physical activity:    Days per week: Not on file    Minutes per session: Not on file  . Stress: Not on file  Relationships  . Social connections:  Talks on phone: Not on file    Gets together: Not on file    Attends religious service: Not on file    Active member of club or organization: Not on file    Attends meetings of clubs or organizations: Not on file    Relationship status: Not on file  Other Topics Concern  . Not on file  Social History Narrative  . Not on file   Additional Social History:                         Sleep: Fair  Appetite:  Fair  Current Medications: Current Facility-Administered Medications  Medication Dose Route Frequency Provider Last Rate Last Dose  . acetaminophen (TYLENOL) tablet 650 mg  650 mg Oral Q6H PRN Clapacs, [name], MD      . alum & mag hydroxide-simeth (MAALOX/MYLANTA) 200-200-20 MG/5ML suspension 30 mL  30 mL Oral Q4H PRN Clapacs, [name], MD      . fluvoxaMINE (LUVOX) tablet  50 mg  50 mg Oral QHS Xan Ingraham B, MD   50 mg at 10/08/18 2122  . hydrOXYzine (ATARAX/VISTARIL) tablet 50 mg  50 mg Oral TID PRN Clapacs, [name], MD      . levothyroxine (SYNTHROID, LEVOTHROID) tablet 50 mcg  50 mcg Oral Q0600 Eder Macek B, MD   50 mcg at 10/09/18 0610  . magnesium hydroxide (MILK OF MAGNESIA) suspension 30 mL  30 mL Oral Daily PRN Clapacs, [name], MD      . risperiDONE (RISPERDAL) tablet 1 mg  1 mg Oral BID Eyva Califano B, MD   1 mg at 10/09/18 0826  . traZODone (DESYREL) tablet 100 mg  100 mg Oral QHS PRN Clapacs, [name], MD      . traZODone (DESYREL) tablet 50 mg  50 mg Oral QHS Clapacs, [name], MD   50 mg at 10/08/18 2125    Lab Results:  Results for orders placed or performed during the hospital encounter of 10/07/18 (from the past 48 hour(s))  Hemoglobin A1c     Status: None   Collection Time: 10/08/18  6:43 AM  Result Value Ref Range   Hgb A1c MFr Bld 5.6 4.8 - 5.6 %    Comment: (NOTE) Pre diabetes:          5.7%-6.4% Diabetes:              >6.4% Glycemic Jamie for   <7.0% adults with diabetes    Mean Plasma Glucose 114.02 mg/dL    Comment: Performed at [facility] [address], [place], [place] [address]  Lipid panel     Status: Abnormal   Collection Time: 10/08/18  6:43 AM  Result Value Ref Range   Cholesterol 230 (H) 0 - 200 mg/dL   Triglycerides 138 <150 mg/dL   HDL 44 >40 mg/dL   Total CHOL/HDL Ratio 5.2 RATIO   VLDL 28 0 - 40 mg/dL   LDL Cholesterol 158 (H) 0 - 99 mg/dL    Comment:        Total Cholesterol/HDL:CHD Risk Coronary Heart Disease Risk Table                     Men   Women  1/2 Average Risk   3.4   3.3  Average Risk       5.0   4.4  2 X Average Risk   9.6   7.1  3  X Average Risk  23.4   11.0        Use the calculated Patient Ratio above and the CHD Risk Table to determine the patient's CHD Risk.        ATP III CLASSIFICATION (LDL):  <100     mg/dL   Optimal  100-129  mg/dL   Near or Above                     Optimal  130-159  mg/dL   Borderline  160-189  mg/dL   High  >190     mg/dL   Very High Performed at [facility], [address] [place], [place] [address]   TSH     Status: Abnormal   Collection Time: 10/08/18  6:43 AM  Result Value Ref Range   TSH 6.847 (H) 0.350 - 4.500 uIU/mL    Comment: Performed by a 3rd Generation assay with a functional sensitivity of <=0.01 uIU/mL. Performed at [facility], [address] [place], [place] [address]   T4, free     Status: Abnormal   Collection Time: 10/08/18  6:43 AM  Result Value Ref Range   Free T4 0.79 (L) 0.82 - 1.77 ng/dL    Comment: (NOTE) Biotin ingestion may interfere with free T4 tests. If the results are inconsistent with the TSH level, previous test results, or the clinical presentation, then consider biotin interference. If needed, order repeat testing after stopping biotin. Performed at [facility], [address] [place], [place] [address]     Blood Alcohol level:  Lab Results  Component Value Date   [facility] <10 21/30/8657    Metabolic Disorder Labs: Lab Results  Component Value Date   HGBA1C 5.6 10/08/2018   MPG 114.02 10/08/2018   No results found for: PROLACTIN Lab Results  Component Value Date   CHOL 230 (H) 10/08/2018   TRIG 138 10/08/2018   HDL 44 10/08/2018   CHOLHDL 5.2 10/08/2018   VLDL 28 10/08/2018   LDLCALC 158 (H) 10/08/2018    Physical Findings: AIMS: Facial and Oral Movements Muscles of Facial Expression: None, normal Lips and Perioral Area: None, normal Jaw: None, normal Tongue: None, normal,Extremity Movements Upper (arms, wrists, hands, fingers): None, normal Lower (legs, knees, ankles, toes): None, normal, Trunk Movements Neck, shoulders, hips: None, normal, Overall Severity Severity of abnormal movements (highest score from questions above): None, normal Incapacitation due to abnormal movements: None, normal Patient's awareness of  abnormal movements (rate only patient's report): No Awareness, Dental Status Current problems with teeth and/or dentures?: Yes Does patient usually wear dentures?: Yes(pt has a partial denture)  CIWA:  CIWA-Ar Total: 0 COWS:     Musculoskeletal: Strength & Muscle Tone: within normal limits Gait & Station: normal Patient leans: N/A  Psychiatric Specialty Exam: Physical Exam  Nursing note and vitals reviewed. Psychiatric: His speech is normal. His mood appears anxious. He is withdrawn. Cognition and memory are normal. He expresses impulsivity. He exhibits a depressed mood. He expresses suicidal ideation.    Review of Systems  Neurological: Negative.   Psychiatric/Behavioral: Positive for depression, hallucinations and suicidal ideas. The patient is nervous/anxious.   All other systems reviewed and are negative.   Pulse 82, temperature 98.2 F (36.8 C), temperature source Oral.There is no height or weight on file to calculate BMI.  General Appearance: Fairly Groomed  Eye Contact:  Good  Speech:  Slow  Volume:  Decreased  Mood:  Anxious, Depressed, Hopeless and Worthless  Affect:  Flat  Thought Process:  Goal Directed and Descriptions of Associations: Intact  Orientation:  Full (Time, Place, and Person)  Thought Content:  Delusions and Paranoid Ideation  Suicidal Thoughts:  Yes.  without intent/plan  Homicidal Thoughts:  No  Memory:  Immediate;   Fair Recent;   Fair Remote;   Fair  Judgement:  Impaired  Insight:  Shallow  Psychomotor Activity:  Psychomotor Retardation  Concentration:  Concentration: Fair and Attention Span: Fair  Recall:  [employer] of Knowledge:  Fair  Language:  Fair  Akathisia:  No  Handed:  Right  AIMS (if indicated):     Assets:  Communication Skills Desire for Improvement Financial Resources/Insurance Physical Health Resilience  ADL's:  Intact  Cognition:  WNL  Sleep:  Number of Hours: 6     Treatment Plan Summary: Daily contact with  patient to assess and evaluate symptoms and progress in treatment and Medication management   Mr. Haymer is a 38 year old male with a history of depression, anxiety and alcoholism in early remission admitted for worsening depression, suicidal ideation, paranoia and hallucinations.  #Suicidal ideation -patient is able to contract for safety in the hospital  #Mood/psychosis -discontinue Zoloft -increase Luvox 100 mg nightly -increase Risperdal to 1 mg BID -Trazodone 50 mg nightly  #Alcoholism -discontinue Naltrexon per patient request  #Hypothyroidism -elevated TSH and low free T4 -start Synthroid 50 mcg  #Labs -lipid panel shows elevated TH and Chol, TSH, A1C are normal -EKG reviewed, NSR with QTc 419  #Disposition -discharge back to [facility] if possible -[facility] with [facility], MD 10/09/2018, 12:21 PM

## 2018-10-09 NOTE — Progress Notes (Signed)
D- Patient alert and oriented. Patient presents in a sullen/depressed, but pleasant mood on assessment stating that he slept ok last night and had no major complaints to voice to this Probation officer. Patient rated his depression and anxiety a "2/10" stating that it's "not as much, it's why I'm here, my mom is resentful of me". Patient denies SI, HI, AVH, and pain at this time stating to this writer "I think I'm getting better in that regard". Patient's goal for today is to "go to all group meetings/activities", in which he will "keep an eye on clock" in order to accomplish this goal.  A- Scheduled medications administered to patient, per MD orders. Support and encouragement provided.  Routine safety checks conducted every 15 minutes.  Patient informed to notify staff with problems or concerns.  R- No adverse drug reactions noted. Patient contracts for safety at this time. Patient compliant with medications and treatment plan. Patient receptive, calm, and cooperative. Patient interacts well with others on the unit.  Patient remains safe at this time.
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
## 2018-10-09 NOTE — Progress Notes (Signed)
Pt pleasant cooperative this shift and denies any suicidal thoughts. Pt reports feeling sad over the fact that her previous group home people did not like him and somebody from the group home lied on him to his mother that has caused confusion. Compliance with medications this shift and currently resting comfortably in bed with respiration noted. Will continue the Q15-minute safety check.

## 2018-10-09 NOTE — Plan of Care (Signed)
Active in the milieu. Able to express feelings and needs. Denying thoughts of self harm. Denying hallucinations. Compliant with treatment.

## 2018-10-10 MED ORDER — FLUVOXAMINE MALEATE 50 MG PO TABS
100.0000 mg | ORAL_TABLET | Freq: Every day | ORAL | Status: DC
  Administered 2018-10-10: 100 mg via ORAL
  Filled 2018-10-10: qty 2

## 2018-10-10 NOTE — Progress Notes (Signed)
Recreation Therapy Notes  INPATIENT RECREATION THERAPY ASSESSMENT  Patient Details Name: Farren Nelles MRN: [id number] DOB: [birth year] Today's Date: 10/10/2018       Information Obtained From: Patient  Able to Participate in Assessment/Interview: Yes  Patient Presentation: Responsive  Reason for Admission (Per Patient): Active Symptoms, Other (Comments)(Living situation)  Patient Stressors:    Coping Skills:   Music, Talk, Other (Comment)(Medication)  Leisure Interests (2+):  Music - Play instrument  Frequency of Recreation/Participation: Monthly  Awareness of Community Resources:  Yes  Community Resources:  [employer]  Current Use:    If no, Barriers?:    Expressed Interest in [place] of Residence:  [place]  Patient Main Form of Transportation: Musician  Patient Strengths:  Honest,Kind, Gentle  Patient Identified Areas of Improvement:  Communicate more  Patient Goal for Hospitalization:  Get the right medication to feel better mentally so I can function  Current SI (including self-harm):  No  Current HI:  No  Current AVH: No  Staff Intervention Plan: Group Attendance, Collaborate with Interdisciplinary Treatment Team  Consent to Intern Participation: N/A  Tena Linebaugh 10/10/2018, 3:26 PM

## 2018-10-10 NOTE — Progress Notes (Signed)
D: Patient stated slept good last night .Stated appetite is good and energy level  low. Stated concentration is good . Stated on Depression scale 2, hopeless 2 and anxiety  2.( low 0-10 high) Denies suicidal  homicidal ideations  .  No auditory hallucinations  No pain concerns . Appropriate ADL'S. Interacting with peers and staff.   A: Encourage patient participation with unit programming . Instruction  Given on  Medication , verbalize understanding.hought  Process altered  Some disorganization noted . Compliant   With medication , verbalize understanding . Working on Radiographer, therapeutic . Attending unit programing . Voice no concerns around  Sleep  Or safety concerns  Focus on discharge . R: Voice no other concerns. Staff continue to monitor

## 2018-10-10 NOTE — BHH Group Notes (Signed)
Overcoming Obstacles  10/10/2018 1PM  Type of Therapy and Topic:  Group Therapy:  Overcoming Obstacles  Participation Level:  Active    Description of Group:    In this group patients will be encouraged to explore what they see as obstacles to their own wellness and recovery. They will be guided to discuss their thoughts, feelings, and behaviors related to these obstacles. The group will process together ways to cope with barriers, with attention given to specific choices patients can make. Each patient will be challenged to identify changes they are motivated to make in order to overcome their obstacles. This group will be process-oriented, with patients participating in exploration of their own experiences as well as giving and receiving support and challenge from other group members.   Therapeutic Goals: 1. Patient will identify personal and current obstacles as they relate to admission. 2. Patient will identify barriers that currently interfere with their wellness or overcoming obstacles.  3. Patient will identify feelings, thought process and behaviors related to these barriers. 4. Patient will identify two changes they are willing to make to overcome these obstacles:      Summary of Patient Progress Actively and appropriately engaged in the group. Patient was able to provide support and validation to other group members.Patient practiced active listening when interacting with the facilitator and other group members.     Therapeutic Modalities:   Cognitive Behavioral Therapy Solution Focused Therapy Motivational Interviewing Relapse Prevention Therapy    Sanjuana Kava, MSW, [place] 10/10/2018 2:56 PM

## 2018-10-10 NOTE — Progress Notes (Signed)
CSW spoke with [facility] admissions and was informed that pt does not meet criteria to be seen there. CSW spoke with client who reported that he already receives therapy and psychiatry services there once a month. CSW contacted admissions at [facility] again and was told to fax over H&P, current meds, and PSA and they will contact pt to schedule a follow-up appointment once his information is reviewed. CSW faxed over information to [phone] as instructed by admissions.   Evalina Field, MSW, LCSW Clinical Social Work 10/10/2018 9:49 AM

## 2018-10-10 NOTE — Progress Notes (Signed)
Recreation Therapy Notes          Teshaun Olarte 10/10/2018 12:25 PM

## 2018-10-10 NOTE — Plan of Care (Signed)
Thought  Process altered  Some disorganization noted . Compliant   With medication , verbalize understanding . Working on Radiographer, therapeutic . Attending unit programing . Voice no concerns around  Sleep  Or safety concerns  Problem: Activity: Goal: Will verbalize the importance of balancing activity with adequate rest periods Outcome: Progressing   Problem: Education: Goal: Will be free of psychotic symptoms Outcome: Progressing Goal: Knowledge of the prescribed therapeutic regimen will improve Outcome: Progressing   Problem: Coping: Goal: Coping ability will improve Outcome: Progressing Goal: Will verbalize feelings Outcome: Progressing   Problem: Health Behavior/Discharge Planning: Goal: Compliance with prescribed medication regimen will improve Outcome: Progressing   Problem: Education: Goal: Utilization of techniques to improve thought processes will improve Outcome: Progressing Goal: Knowledge of the prescribed therapeutic regimen will improve Outcome: Progressing   Problem: Activity: Goal: Interest or engagement in leisure activities will improve Outcome: Progressing Goal: Imbalance in normal sleep/wake cycle will improve Outcome: Progressing   Problem: Coping: Goal: Coping ability will improve Outcome: Progressing Goal: Will verbalize feelings Outcome: Progressing   Problem: Role Relationship: Goal: Will demonstrate positive changes in social behaviors and relationships Outcome: Progressing   Problem: Safety: Goal: Ability to disclose and discuss suicidal ideas will improve Outcome: Progressing Goal: Ability to identify and utilize support systems that promote safety will improve Outcome: Progressing   Problem: Education: Goal: Knowledge of [facility] Education information/materials will improve Outcome: Progressing Goal: Emotional status will improve Outcome: Progressing Goal: Mental status will improve Outcome: Progressing Goal: Verbalization of  understanding the information provided will improve Outcome: Progressing   Problem: Activity: Goal: Interest or engagement in activities will improve Outcome: Progressing Goal: Sleeping patterns will improve Outcome: Progressing   Problem: Coping: Goal: Ability to verbalize frustrations and anger appropriately will improve Outcome: Progressing Goal: Ability to demonstrate self-control will improve Outcome: Progressing   Problem: Health Behavior/Discharge Planning: Goal: Identification of resources available to assist in meeting health care needs will improve Outcome: Progressing Goal: Compliance with treatment plan for underlying cause of condition will improve Outcome: Progressing   Problem: Physical Regulation: Goal: Ability to maintain clinical measurements within normal limits will improve Outcome: Progressing   Problem: Safety: Goal: Periods of time without injury will increase Outcome: Progressing

## 2018-10-10 NOTE — BHH Group Notes (Signed)
[facility] Group Notes:  (Nursing/MHT/Case Management/Adjunct)  Date:  10/10/2018  Time:  4:36 PM  Type of Therapy:  Psychoeducational Skills  Participation Level:  Active  Participation Quality:  Appropriate  Affect:  Appropriate  Cognitive:  Alert and Appropriate  Insight:  Appropriate  Engagement in Group:  Engaged  Modes of Intervention:  Discussion, Education and Support  Summary of Progress/Problems:  Adela Lank [facility] 10/10/2018, 4:36 PM

## 2018-10-10 NOTE — BHH Suicide Risk Assessment (Signed)
[place] INPATIENT:  Family/Significant Other Suicide Prevention Education  Suicide Prevention Education:  Patient Refusal for Family/Significant Other Suicide Prevention Education: The patient Jamie Rivers has refused to provide written consent for family/significant other to be provided Family/Significant Other Suicide Prevention Education during admission and/or prior to discharge.  Physician notified.  SPE completed with pt, as pt refused to consent to family contact. SPI pamphlet provided to pt and pt was encouraged to share information with support network, ask questions, and talk about any concerns relating to SPE. Pt denies access to guns/firearms and verbalized understanding of information provided. Mobile Crisis information also provided to pt.    [place] MSW LCSW 10/10/2018, 9:11 AM

## 2018-10-10 NOTE — Progress Notes (Signed)
[facility] MD Progress Note  10/10/2018 9:33 AM Yaman Grauberger  MRN:  [id number]  Subjective:   Mr. Valorie Roosevelt feels crushed that no supports arrived since he came to the hospital. He received no phone calls or visitors either from family or his [employer]. He is from [place] and did not initiate any contact with them. It is likely that noone is aware he is in [facility]. He acceptes medications and tolerates them well but there is very little progress. He is stiill paranoid and delusional, hopeless and suicidal.   He met with treatment tem today. He is unable to participate in discharge planning.   Principal Problem: Severe recurrent major depressive disorder with psychotic features ([place]) Diagnosis: Principal Problem:   Severe recurrent major depressive disorder with psychotic features ([place]) Active Problems:   Hypothyroidism   OCD (obsessive compulsive disorder)  Total Time spent with patient: 20 minutes  Past Psychiatric History: depression, alcoholism  Past Medical History:  Past Medical History:  Diagnosis Date  . Anxiety   . 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 bone graft surgery from right hip to support tooth      age 38   . brain tumor surgery      age 38 per patient - benign per patientign per patient   . CLEFT PALATE REPAIR    . closure of roof of mouth surgery      age 38   . ROBOTIC ASSITED PARTIAL NEPHRECTOMY Left 06/01/2016   Procedure: XI ROBOTIC ASSITED PARTIAL NEPHRECTOMY;  Surgeon: Raynelle Bring, MD;  Location: [facility];  Service: Urology;  Laterality: Left;   Family History:  Family History  Problem Relation Age of Onset  . Hypertension Mother    Family Psychiatric  History: depression  Social History:  Social History   Substance and Sexual Activity  Alcohol Use Not Currently   Comment: 4-5 nites per week has beer- 2-4 beers per nite      Social History   Substance and Sexual  Activity  Drug Use No    Social History   Socioeconomic History  . Marital status: Single    Spouse name: Not on file  . Number of children: Not on file  . Years of education: Not on file  . Highest education level: Not on file  Occupational History  . Not on file  Social Needs  . Financial resource strain: Not on file  . Food insecurity:    Worry: Not on file    Inability: Not on file  . Transportation needs:    Medical: Not on file    Non-medical: Not on file  Tobacco Use  . Smoking status: Never Smoker  . Smokeless tobacco: Never Used  Substance and Sexual Activity  . Alcohol use: Not Currently    Comment: 4-5 nites per week has beer- 2-4 beers per nite   . Drug use: No  . Sexual activity: Not Currently  Lifestyle  . Physical activity:    Days per week: Not on file    Minutes per session: Not on file  . Stress: Not on file  Relationships  . Social connections:    Talks on phone: Not on file    Gets together: Not on file    Attends religious service: Not on file    Active member of club or organization: Not on file    Attends meetings of clubs or organizations: Not on  file    Relationship status: Not on file  Other Topics Concern  . Not on file  Social History Narrative  . Not on file   Additional Social History:                         Sleep: Fair  Appetite:  Fair  Current Medications: Current Facility-Administered Medications  Medication Dose Route Frequency Provider Last Rate Last Dose  . acetaminophen (TYLENOL) tablet 650 mg  650 mg Oral Q6H PRN Clapacs, [name], MD      . alum & mag hydroxide-simeth (MAALOX/MYLANTA) 200-200-20 MG/5ML suspension 30 mL  30 mL Oral Q4H PRN Clapacs, [name], MD      . fluvoxaMINE (LUVOX) tablet 100 mg  100 mg Oral QHS ,  B, MD      . hydrOXYzine (ATARAX/VISTARIL) tablet 50 mg  50 mg Oral TID PRN Clapacs, [name], MD      . levothyroxine (SYNTHROID, LEVOTHROID) tablet 50 mcg  50 mcg Oral Q0600  ,  B, MD   50 mcg at 10/10/18 9628  . magnesium hydroxide (MILK OF MAGNESIA) suspension 30 mL  30 mL Oral Daily PRN Clapacs, [name], MD      . risperiDONE (RISPERDAL) tablet 1 mg  1 mg Oral BID ,  B, MD   1 mg at 10/10/18 0800  . traZODone (DESYREL) tablet 50 mg  50 mg Oral QHS Clapacs, [name], MD   50 mg at 10/09/18 2127    Lab Results: No results found for this or any previous visit (from the past 48 hour(s)).  Blood Alcohol level:  Lab Results  Component Value Date   ETH <10 38/62/9476    Metabolic Disorder Labs: Lab Results  Component Value Date   HGBA1C 5.6 10/08/2018   MPG 114.02 10/08/2018   No results found for: PROLACTIN Lab Results  Component Value Date   CHOL 230 (H) 10/08/2018   TRIG 138 10/08/2018   HDL 44 10/08/2018   CHOLHDL 5.2 10/08/2018   VLDL 28 10/08/2018   LDLCALC 158 (H) 10/08/2018    Physical Findings: AIMS: Facial and Oral Movements Muscles of Facial Expression: None, normal Lips and Perioral Area: None, normal Jaw: None, normal Tongue: None, normal,Extremity Movements Upper (arms, wrists, hands, fingers): None, normal Lower (legs, knees, ankles, toes): None, normal, Trunk Movements Neck, shoulders, hips: None, normal, Overall Severity Severity of abnormal movements (highest score from questions above): None, normal Incapacitation due to abnormal movements: None, normal Patient's awareness of abnormal movements (rate only patient's report): No Awareness, Dental Status Current problems with teeth and/or dentures?: Yes Does patient usually wear dentures?: Yes(pt has a partial denture)  CIWA:  CIWA-Ar Total: 0 COWS:     Musculoskeletal: Strength & Muscle Tone: within normal limits Gait & Station: normal Patient leans: N/A  Psychiatric Specialty Exam: Physical Exam  Nursing note and vitals reviewed. Psychiatric: His speech is normal. His mood appears anxious. He is slowed and withdrawn. Thought content is  paranoid and delusional. Cognition and memory are normal. He expresses impulsivity. He exhibits a depressed mood.    Review of Systems  Neurological: Negative.   Psychiatric/Behavioral: Positive for depression and suicidal ideas. The patient is nervous/anxious.   All other systems reviewed and are negative.   Blood pressure 121/90, pulse 91, temperature 98.1 F (36.7 C), temperature source Oral, resp. rate 16, SpO2 100 %.There is no height or weight on file to calculate BMI.  General Appearance:  Disheveled  Eye Contact:  Fair  Speech:  Clear and Coherent  Volume:  Decreased  Mood:  Depressed, Hopeless and Worthless  Affect:  Blunt  Thought Process:  Goal Directed and Descriptions of Associations: Intact  Orientation:  Full (Time, Place, and Person)  Thought Content:  Illogical, Delusions and Paranoid Ideation  Suicidal Thoughts:  No  Homicidal Thoughts:  No  Memory:  Immediate;   Fair Recent;   Fair Remote;   Fair  Judgement:  Impaired  Insight:  Shallow  Psychomotor Activity:  Decreased  Concentration:  Concentration: Fair and Attention Span: Fair  Recall:  [employer] of Knowledge:  Fair  Language:  Fair  Akathisia:  No  Handed:  Right  AIMS (if indicated):     Assets:  Communication Skills Desire for Improvement Financial Resources/Insurance Housing Physical Health Resilience  ADL's:  Intact  Cognition:  WNL  Sleep:  Number of Hours: 8.25     Treatment Plan Summary: Daily contact with patient to assess and evaluate symptoms and progress in treatment and Medication management   Mr. Vincent is a 38 year old male with a history of depression, anxiety and alcoholism in early remission admitted for worsening depression, suicidal ideation, paranoia and hallucinations.  #Suicidal ideation, still suicidal -patient is able to contract for safety in the hospital  #Mood/psychosis, not improving -discontinue Zoloft -increase Luvox 100 mg nightly -increase Risperdal to 1  mg BID -Trazodone 50 mg nightly  #Alcoholism -discontinue Naltrexon per patient request  #Hypothyroidism -elevated TSH and low free T4 -start Synthroid 50 mcg  #Labs -lipid panel shows elevated TH and Chol, TSH, A1C are normal -EKGreviewed, NSR with QTc 419  #Disposition -discharge back to [facility] if possible -follow up with [facility] Noah Delaine, MD 10/10/2018, 9:33 AM

## 2018-10-10 NOTE — Progress Notes (Signed)
CSW spoke with pt after he met with Ceasar Lund of [employer] [facility] home. Pt reported he was not interested due to not wanting to apply for social security and wanting to work. Pt agreed to call his [facility] later this evening to inquire about whether or not he still has his bed there.  Evalina Field, MSW, LCSW Clinical Social Work 10/10/2018 3:49 PM

## 2018-10-10 NOTE — Tx Team (Addendum)
Interdisciplinary Treatment and Diagnostic Plan Update  10/10/2018 Time of Session: [place] MRN: [id number]  Principal Diagnosis: Severe recurrent major depressive disorder with psychotic features [facility])  Secondary Diagnoses: Principal Problem:   Severe recurrent major depressive disorder with psychotic features ([place]) Active Problems:   Hypothyroidism   OCD (obsessive compulsive disorder)   Current Medications:  Current Facility-Administered Medications  Medication Dose Route Frequency Provider Last Rate Last Dose  . acetaminophen (TYLENOL) tablet 650 mg  650 mg Oral Q6H PRN Clapacs, [name], MD      . alum & mag hydroxide-simeth (MAALOX/MYLANTA) 200-200-20 MG/5ML suspension 30 mL  30 mL Oral Q4H PRN Clapacs, [name], MD      . fluvoxaMINE (LUVOX) tablet 100 mg  100 mg Oral QHS Pucilowska, [name], MD      . hydrOXYzine (ATARAX/VISTARIL) tablet 50 mg  50 mg Oral TID PRN Clapacs, [name], MD      . levothyroxine (SYNTHROID, LEVOTHROID) tablet 50 mcg  50 mcg Oral Q0600 Pucilowska, [name], MD   50 mcg at 10/10/18 0102  . magnesium hydroxide (MILK OF MAGNESIA) suspension 30 mL  30 mL Oral Daily PRN Clapacs, [name], MD      . risperiDONE (RISPERDAL) tablet 1 mg  1 mg Oral BID Pucilowska, [name], MD   1 mg at 10/10/18 0800  . traZODone (DESYREL) tablet 50 mg  50 mg Oral QHS Clapacs, [name], MD   50 mg at 10/09/18 2127   PTA Medications: Medications Prior to Admission  Medication Sig Dispense Refill Last Dose  . HYDROcodone-acetaminophen (NORCO) 5-325 MG tablet Take 1-2 tablets by mouth every 6 (six) hours as needed for moderate pain or severe pain. (Patient not taking: Reported on 10/06/2018) 30 tablet 0 Completed Course at Unknown time  . LORazepam (ATIVAN) 1 MG tablet Take 1 tablet (1 mg total) by mouth 2 (two) times daily. (Patient not taking: Reported on 04/02/2016) 10 tablet 0 Completed Course at Unknown time  . Multiple Vitamins-Minerals (MULTIVITAMIN ADULT) TABS  Take 1 tablet by mouth daily.   10/05/2018 at Unknown time  . naltrexone (DEPADE) 50 MG tablet Take 50 mg by mouth at bedtime.   10/05/2018 at Unknown time  . sertraline (ZOLOFT) 100 MG tablet Take 150 mg by mouth daily.    10/06/2018 at Unknown time  . traZODone (DESYREL) 50 MG tablet Take 50 mg by mouth at bedtime.    10/05/2018 at Unknown time    Patient Stressors: Medication change or noncompliance Substance abuse  Patient Strengths: Active sense of humor General fund of knowledge Motivation for treatment/growth  Treatment Modalities: Medication Management, Group therapy, Case management,  1 to 1 session with clinician, Psychoeducation, Recreational therapy.   Physician Treatment Plan for Primary Diagnosis: Severe recurrent major depressive disorder with psychotic features ([place]) Long Term Goal(s): Improvement in symptoms so as ready for discharge Improvement in symptoms so as ready for discharge   Short Term Goals: Ability to identify changes in lifestyle to reduce recurrence of condition will improve Ability to verbalize feelings will improve Ability to disclose and discuss suicidal ideas Ability to demonstrate self-control will improve Ability to identify and develop effective coping behaviors will improve Ability to maintain clinical measurements within normal limits will improve Ability to identify triggers associated with substance abuse/mental health issues will improve Ability to identify changes in lifestyle to reduce recurrence of condition will improve Ability to demonstrate self-control will improve Ability to identify triggers associated with substance abuse/mental health  issues will improve  Medication Management: Evaluate patient's response, side effects, and tolerance of medication regimen.  Therapeutic Interventions: 1 to 1 sessions, Unit Group sessions and Medication administration.  Evaluation of Outcomes: Progressing  Physician Treatment Plan for Secondary  Diagnosis: Principal Problem:   Severe recurrent major depressive disorder with psychotic features ([place]) Active Problems:   Hypothyroidism   OCD (obsessive compulsive disorder)  Long Term Goal(s): Improvement in symptoms so as ready for discharge Improvement in symptoms so as ready for discharge   Short Term Goals: Ability to identify changes in lifestyle to reduce recurrence of condition will improve Ability to verbalize feelings will improve Ability to disclose and discuss suicidal ideas Ability to demonstrate self-control will improve Ability to identify and develop effective coping behaviors will improve Ability to maintain clinical measurements within normal limits will improve Ability to identify triggers associated with substance abuse/mental health issues will improve Ability to identify changes in lifestyle to reduce recurrence of condition will improve Ability to demonstrate self-control will improve Ability to identify triggers associated with substance abuse/mental health issues will improve     Medication Management: Evaluate patient's response, side effects, and tolerance of medication regimen.  Therapeutic Interventions: 1 to 1 sessions, Unit Group sessions and Medication administration.  Evaluation of Outcomes: Progressing   RN Treatment Plan for Primary Diagnosis: Severe recurrent major depressive disorder with psychotic features ([place]) Long Term Goal(s): Knowledge of disease and therapeutic regimen to maintain health will improve  Short Term Goals: Ability to demonstrate self-control, Ability to participate in decision making will improve, Ability to verbalize feelings will improve and Ability to disclose and discuss suicidal ideas  Medication Management: RN will administer medications as ordered by provider, will assess and evaluate patient's response and provide education to patient for prescribed medication. RN will report any adverse and/or side effects to  prescribing provider.  Therapeutic Interventions: 1 on 1 counseling sessions, Psychoeducation, Medication administration, Evaluate responses to treatment, Monitor vital signs and CBGs as ordered, Perform/monitor CIWA, COWS, AIMS and Fall Risk screenings as ordered, Perform wound care treatments as ordered.  Evaluation of Outcomes: Progressing   LCSW Treatment Plan for Primary Diagnosis: Severe recurrent major depressive disorder with psychotic features ([place]) Long Term Goal(s): Safe transition to appropriate next level of care at discharge, Engage patient in therapeutic group addressing interpersonal concerns.  Short Term Goals: Engage patient in aftercare planning with referrals and resources, Increase social support, Increase emotional regulation, Facilitate acceptance of mental health diagnosis and concerns and Increase skills for wellness and recovery  Therapeutic Interventions: Assess for all discharge needs, 1 to 1 time with Social worker, Explore available resources and support systems, Assess for adequacy in community support network, Educate family and significant other(s) on suicide prevention, Complete Psychosocial Assessment, Interpersonal group therapy.  Evaluation of Outcomes: Progressing   Progress in Treatment: Attending groups: Yes. Participating in groups: Yes. Taking medication as prescribed: Yes. Toleration medication: Yes. Family/Significant other contact made: No, pt declined collateral contact Patient understands diagnosis: Yes. Discussing patient identified problems/goals with staff: Yes. Medical problems stabilized or resolved: Yes. Denies suicidal/homicidal ideation: Yes. Issues/concerns per patient self-inventory: No. Other: n/a  New problem(s) identified: No, Describe:  n/a  New Short Term/Long Term Goal(s): elimination of AVH/symptoms of psychosis, medication management for mood stabilization; elimination of SI thoughts; development of comprehensive mental  wellness/sobriety plan.   Patient Goals:  "Get on the right medications and dosage so I feel better mentally and physically to be able to function"  Discharge Plan or  Barriers: SPE pamphlet, Mobile Crisis information, and AA/NA information provided to patient for additional community support and resources. Pt is established at [facility] and information was faxed over so pt can be scheduled for a follow-up appointment. Pt is attempting to reach out to his [facility] to ensure that he is able to return at [employer].   Reason for Continuation of Hospitalization: Anxiety Depression Medication stabilization  Estimated Length of Stay: Monday 10/17/18    Attendees: Patient:Jamie Rivers 10/10/2018 11:48 AM  Physician: Dr Bary Leriche MD 10/10/2018 11:48 AM  Nursing: Polly Cobia RN 10/10/2018 11:48 AM  RN Care Manager: 10/10/2018 11:48 AM  Social Worker: Minette Brine Moton LCSW 10/10/2018 11:48 AM  Recreational Therapist: Roanna Epley CTRS LRT 10/10/2018 11:48 AM  Other: Assunta Curtis LCSW 10/10/2018 11:48 AM  Other:  10/10/2018 11:48 AM  Other: 10/10/2018 11:48 AM    Scribe for Treatment Team: Mariann Laster Moton, LCSW 10/10/2018 11:48 AM

## 2018-10-11 MED ORDER — RISPERIDONE 1 MG PO TABS: 2.0000 mg | ORAL_TABLET | Freq: Every day | ORAL | Status: DC

## 2018-10-11 MED ORDER — TRAZODONE HCL 50 MG PO TABS: 50.0000 mg | ORAL_TABLET | Freq: Every day | ORAL | 1 refills | Status: AC

## 2018-10-11 MED ORDER — HYDROXYZINE HCL 50 MG PO TABS: 50.0000 mg | ORAL_TABLET | Freq: Three times a day (TID) | ORAL | 1 refills | Status: AC | PRN

## 2018-10-11 MED ORDER — LEVOTHYROXINE SODIUM 50 MCG PO TABS: 50.0000 ug | ORAL_TABLET | Freq: Every day | ORAL | 1 refills | Status: AC

## 2018-10-11 MED ORDER — RISPERIDONE 2 MG PO TABS: 2.0000 mg | ORAL_TABLET | Freq: Every day | ORAL | 1 refills | Status: AC

## 2018-10-11 MED ORDER — FLUVOXAMINE MALEATE 100 MG PO TABS: 100.0000 mg | ORAL_TABLET | Freq: Every day | ORAL | 1 refills | Status: AC

## 2018-10-11 NOTE — Progress Notes (Signed)
Recreation Therapy Notes  INPATIENT RECREATION TR PLAN  Patient Details Name: Jamie Rivers MRN: [id number] DOB: [birth year] Today's Date: 10/11/2018  Rec Therapy Plan Is patient appropriate for Therapeutic Recreation?: Yes Treatment times per week: at least 3 Estimated Length of Stay: 5-7 days TR Treatment/Interventions: Group participation (Comment)  Discharge Criteria Pt will be discharged from therapy if:: Discharged Treatment plan/goals/alternatives discussed and agreed upon by:: Patient/family  Discharge Summary Short term goals set: Patient will identify 3 positive coping skills strategies to use post d/c within 5 recreation therapy group sessions Short term goals met: Adequate for discharge Progress toward goals comments: Groups attended Which groups?: Goal setting, Other (Comment)(Relaxation) Reason goals not met: N/A Therapeutic equipment acquired: N/A Reason patient discharged from therapy: Discharge from hospital Pt/family agrees with progress & goals achieved: Yes Date patient discharged from therapy: 10/11/18   [name] 10/11/2018, 12:02 PM

## 2018-10-11 NOTE — Progress Notes (Signed)
  [facility] Adult Case Management Discharge Plan :  Will you be returning to the same living situation after discharge:  Yes,  Returning to [place] discharge, do you have transportation home?: Yes,  friend or bus pass Do you have the ability to pay for your medications: Yes,  BC[employer]  Release of information consent forms completed and in the chart  Patient to Follow up at: Follow-up Information    [facility] Follow up.   Specialty:  Behavioral Health Why:  They will reach out to you to schedule a follow-up appointment. Your records have been faxed over. Thank you! Contact information: [place]. [place] [address] [phone]          Next level of care provider has access to [place] and Suicide Prevention discussed: Yes,  SPE completed with pt  Have you used any form of tobacco in the last 30 days? (Cigarettes, Smokeless Tobacco, Cigars, and/or Pipes): No  Has patient been referred to the Quitline?: N/A patient is not a smoker  Patient has been referred for addiction treatment: Yes  Mariann Laster Bellanie Matthew, LCSW 10/11/2018, 9:08 AM

## 2018-10-11 NOTE — Plan of Care (Signed)
Patient is stable and alert oriented x 4, doing well in unit and maintaining safety, socializes with peers with out any issues . Patient is anticipating discharge to a group home., patient is compliant with his medicines with out any side effects.  Patient states appointee is good and sleep is better without any interruptions  patient attends groups with peers , mood is cheerful and affect is congruent mood , patient rates ,   Patient denies any SI/HI/AVH 15 minute safety rounding is maintained no distress noted.   Problem: Activity: Goal: Will verbalize the importance of balancing activity with adequate rest periods Outcome: Progressing   Problem: Education: Goal: Will be free of psychotic symptoms Outcome: Progressing Goal: Knowledge of the prescribed therapeutic regimen will improve Outcome: Progressing   Problem: Coping: Goal: Coping ability will improve Outcome: Progressing Goal: Will verbalize feelings Outcome: Progressing   Problem: Health Behavior/Discharge Planning: Goal: Compliance with prescribed medication regimen will improve Outcome: Progressing   Problem: Education: Goal: Utilization of techniques to improve thought processes will improve Outcome: Progressing Goal: Knowledge of the prescribed therapeutic regimen will improve Outcome: Progressing   Problem: Activity: Goal: Interest or engagement in leisure activities will improve Outcome: Progressing Goal: Imbalance in normal sleep/wake cycle will improve Outcome: Progressing   Problem: Coping: Goal: Coping ability will improve Outcome: Progressing Goal: Will verbalize feelings Outcome: Progressing   Problem: Role Relationship: Goal: Will demonstrate positive changes in social behaviors and relationships Outcome: Progressing   Problem: Safety: Goal: Ability to disclose and discuss suicidal ideas will improve Outcome: Progressing Goal: Ability to identify and utilize support systems that promote safety  will improve Outcome: Progressing   Problem: Education: Goal: Knowledge of [place] [facility] Education information/materials will improve Outcome: Progressing Goal: Emotional status will improve Outcome: Progressing Goal: Mental status will improve Outcome: Progressing Goal: Verbalization of understanding the information provided will improve Outcome: Progressing   Problem: Activity: Goal: Interest or engagement in activities will improve Outcome: Progressing Goal: Sleeping patterns will improve Outcome: Progressing   Problem: Coping: Goal: Ability to verbalize frustrations and anger appropriately will improve Outcome: Progressing Goal: Ability to demonstrate self-control will improve Outcome: Progressing   Problem: Health Behavior/Discharge Planning: Goal: Identification of resources available to assist in meeting health care needs will improve Outcome: Progressing Goal: Compliance with treatment plan for underlying cause of condition will improve Outcome: Progressing   Problem: Physical Regulation: Goal: Ability to maintain clinical measurements within normal limits will improve Outcome: Progressing   Problem: Safety: Goal: Periods of time without injury will increase Outcome: Progressing

## 2018-10-11 NOTE — Progress Notes (Signed)
D: Patient is aware of  Discharge this shift .Patient denies suicidal /homicidal ideations. Patient received all belongings brought in   A: No Storage medications. Writer reviewed Discharge Summary, Suicide Risk Assessment, and Transitional Record. Patient also received Prescriptions   from  MD. Aware  Of follow up appointment . Follow up return letter given to patient  For work   R: Patient left unit with no questions  Or concerns by taxi

## 2018-10-11 NOTE — Discharge Summary (Addendum)
Physician Discharge Summary Note  Patient:  Jamie Rivers is an 38 y.o., male MRN:  [id number] DOB:  1980/09/09 Patient phone:  [phone] (home)  Patient address:   [place] [address],  Total Time spent with patient: 20 minutes plus 15 min on care coordination and medication management  Date of Admission:  10/07/2018 Date of Discharge: 09/10/2018  Reason for Admission:  Suicidal ideation.  History of Present Illness:   Identifying data. Jamie Rivers is a 38 year old male with a history of depression.  Chief complaint. "A lot of pressure."  History of present illness. Information was obtained from the patient and the chart. The patient came tyo [facility] ER complaining of new onset of suicidal thinking, hallucinations and paranoia. He was in rehab at the [facility] last fall and moved into [facility]. He was started on Zoloft and Naltrexon and his Zoloft was recently increased. The patient believes that Naltrexon "messes him up" and wants to stop it, at least temporary. He sees no benefits from Zoloft. He reports that he worries that his roommates are conspiring with his mother and talk behind his back. Even when home alone, he can hear their voices. He feels his depression has been worsening over time but suicidal thinking is new and frightening. He reports poor sleep and appetite, anhedonia, feeling of guilt hopelessness worthlessness, poor energy and concentration, social isolation. He feels he is constantly judged and under appreciated. He feels that his roommates turn against him as he is "multiracial" which was discovered through commercial DNA testing. His family has been accusing him of putting them off since he developed intense preoccupation with Jewish faith. He reports severe anxiety with social anxiety "all his life", infrequent panic attacks, and OCD. He worries, ruminates and organizes a lot. He suspects that he is on autism spectrum.  Since last  fall, he has not been using alcohol or other substances.  Past psychiatric history. Never hospitalized. No suicide attempts. He is on the Zoloft, Trazodone and Naltrexone. Went to rehab at the [facility] once.  Family psychiatric history,. Depression, anxiety, alcoholism, Schizophrenia and suicide on his mother's side.  Social history. Master in music from [place]. 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 Lost his one bedroom apartment and still owes them money. Lives at the [facility], increasingly dissatisfied with his arrangements. Since childhood he has had brain tumor removed and partial nephrectomy for kidney cancer.  Principal Problem: Severe recurrent major depressive disorder with psychotic features [facility]) Discharge Diagnoses: Principal Problem:   Severe recurrent major depressive disorder with psychotic features ([employer]) Active Problems:   Hypothyroidism   OCD (obsessive compulsive disorder)   Past Medical History:  Past Medical History:  Diagnosis Date  . Anxiety   . 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 bone graft surgery from right hip to support tooth      age 68   . brain tumor surgery      age 59 per patient - benign per patient   . CLEFT PALATE REPAIR    . closure of roof of mouth surgery      age 42   . ROBOTIC ASSITED PARTIAL NEPHRECTOMY Left 06/01/2016   Procedure: XI ROBOTIC ASSITED PARTIAL NEPHRECTOMY;  Surgeon: Raynelle Bring, MD;  Location: [facility];  Service: Urology;  Laterality: Left;   Family History:  Family History  Problem Relation Age  of Onset  . Hypertension Mother    Social History:  Social History   Substance and Sexual Activity  Alcohol Use Not Currently   Comment: 4-5 nites per week has beer- 2-4 beers per nite      Social History   Substance and Sexual Activity  Drug Use No    Social History   Socioeconomic History  . Marital  status: Single    Spouse name: Not on file  . Number of children: Not on file  . Years of education: Not on file  . Highest education level: Not on file  Occupational History  . Not on file  Social Needs  . Financial resource strain: Not on file  . Food insecurity:    Worry: Not on file    Inability: Not on file  . Transportation needs:    Medical: Not on file    Non-medical: Not on file  Tobacco Use  . Smoking status: Never Smoker  . Smokeless tobacco: Never Used  Substance and Sexual Activity  . Alcohol use: Not Currently    Comment: 4-5 nites per week has beer- 2-4 beers per nite   . Drug use: No  . Sexual activity: Not Currently  Lifestyle  . Physical activity:    Days per week: Not on file    Minutes per session: Not on file  . Stress: Not on file  Relationships  . Social connections:    Talks on phone: Not on file    Gets together: Not on file    Attends religious service: Not on file    Active member of club or organization: Not on file    Attends meetings of clubs or organizations: Not on file    Relationship status: Not on file  Other Topics Concern  . Not on file  Social History Narrative  . Not on file    Hospital Course:    Jamie Rivers is a 38 year old male with a history of depression, anxiety and alcoholism in early remission admitted for worsening depression, suicidal ideation, paranoia and hallucinations. He accepted medication adjustment and tolerated them well. At the time of discharge, the patient is no longer suicidal, homicidal or psychotic. He is able to contrcat for safety. He is forward thinking and optimistic about the fuutre.  #Mood/psychosis, improved -continue Luvox100mg  nightly -Risperdal 2 mg nightly -Trazodone 50 mg nightly  #Alcoholism -we discontinued Naltrexon per patient request  #Hypothyroidism -elevated TSH and low free T4 -start Synthroid 50 mcg  #Labs -lipid panelshows elevated Tg and Chol, A1Cais  normal -EKGreviewed, NSR with QTc 419  #Disposition -discharge back to his [employer] -follow up with [facility] outpatient service   Physical Findings: AIMS: Facial and Oral Movements Muscles of Facial Expression: None, normal Lips and Perioral Area: None, normal Jaw: None, normal Tongue: None, normal,Extremity Movements Upper (arms, wrists, hands, fingers): None, normal Lower (legs, knees, ankles, toes): None, normal, Trunk Movements Neck, shoulders, hips: None, normal, Overall Severity Severity of abnormal movements (highest score from questions above): None, normal Incapacitation due to abnormal movements: None, normal Patient's awareness of abnormal movements (rate only patient's report): No Awareness, Dental Status Current problems with teeth and/or dentures?: Yes Does patient usually wear dentures?: Yes(pt has a partial denture)  CIWA:  CIWA-Ar Total: 0 COWS:     Musculoskeletal: Strength & Muscle Tone: within normal limits Gait & Station: normal Patient leans: N/A  Psychiatric Specialty Exam: Physical Exam  Nursing note and vitals reviewed. Psychiatric: He has a normal  mood and affect. His speech is normal and behavior is normal. Thought content normal. Cognition and memory are normal. He expresses impulsivity.    Review of Systems  Neurological: Negative.   Psychiatric/Behavioral: Negative.   All other systems reviewed and are negative.   Blood pressure (!) 146/88, pulse (!) 103, temperature 98.3 F (36.8 C), temperature source Oral, resp. rate 16, SpO2 100 %.There is no height or weight on file to calculate BMI.  General Appearance: Casual  Eye Contact:  Good  Speech:  Clear and Coherent  Volume:  Normal  Mood:  Euthymic  Affect:  Appropriate  Thought Process:  Goal Directed and Descriptions of Associations: Intact  Orientation:  Full (Time, Place, and Person)  Thought Content:  WDL  Suicidal Thoughts:  No  Homicidal Thoughts:  No  Memory:   Immediate;   Fair Recent;   Fair Remote;   Fair  Judgement:  Impaired  Insight:  Present  Psychomotor Activity:  Normal  Concentration:  Concentration: Fair and Attention Span: Fair  Recall:  [employer] of Knowledge:  Fair  Language:  Fair  Akathisia:  No  Handed:  Right  AIMS (if indicated):     Assets:  Communication Skills Desire for Improvement Financial Resources/Insurance Housing Physical Health Resilience Social Support  ADL's:  Intact  Cognition:  WNL  Sleep:  Number of Hours: 7     Have you used any form of tobacco in the last 30 days? (Cigarettes, Smokeless Tobacco, Cigars, and/or Pipes): No  Has this patient used any form of tobacco in the last 30 days? (Cigarettes, Smokeless Tobacco, Cigars, and/or Pipes) Yes, No  Blood Alcohol level:  Lab Results  Component Value Date   ETH <10 78/29/5621    Metabolic Disorder Labs:  Lab Results  Component Value Date   HGBA1C 5.6 10/08/2018   MPG 114.02 10/08/2018   No results found for: PROLACTIN Lab Results  Component Value Date   CHOL 230 (H) 10/08/2018   TRIG 138 10/08/2018   HDL 44 10/08/2018   CHOLHDL 5.2 10/08/2018   VLDL 28 10/08/2018   LDLCALC 158 (H) 10/08/2018    See Psychiatric Specialty Exam and Suicide Risk Assessment completed by Attending Physician prior to discharge.  Discharge destination:  Home  Is patient on multiple antipsychotic therapies at discharge:  No   Has Patient had three or more failed trials of antipsychotic monotherapy by history:  No  Recommended Plan for Multiple Antipsychotic Therapies: NA  Discharge Instructions    Diet - low sodium heart healthy   Complete by:  As directed    Increase activity slowly   Complete by:  As directed      Allergies as of 10/11/2018   No Known Allergies     Medication List    STOP taking these medications   HYDROcodone-acetaminophen 5-325 MG tablet Commonly known as:  NORCO   LORazepam 1 MG tablet Commonly known as:  ATIVAN    MULTIVITAMIN ADULT Tabs   naltrexone 50 MG tablet Commonly known as:  DEPADE   sertraline 100 MG tablet Commonly known as:  ZOLOFT     TAKE these medications     Indication  fluvoxaMINE 100 MG tablet Commonly known as:  LUVOX Take 1 tablet (100 mg total) by mouth at bedtime.  Indication:  Major Depressive Disorder, Obsessive Compulsive Disorder, Panic Disorder   hydrOXYzine 50 MG tablet Commonly known as:  ATARAX/VISTARIL Take 1 tablet (50 mg total) by mouth 3 (three) times daily as  needed for anxiety.  Indication:  Feeling Anxious   levothyroxine 50 MCG tablet Commonly known as:  SYNTHROID, LEVOTHROID Take 1 tablet (50 mcg total) by mouth daily at 6 (six) AM. Start taking on:  October 12, 2018  Indication:  Underactive Thyroid   risperiDONE 2 MG tablet Commonly known as:  RISPERDAL Take 1 tablet (2 mg total) by mouth at bedtime. Start taking on:  October 12, 2018  Indication:  Major Depressive Disorder, Obsessive Compulsive Disorder   traZODone 50 MG tablet Commonly known as:  DESYREL Take 1 tablet (50 mg total) by mouth at bedtime.  Indication:  Trouble Sleeping      Follow-up Information    [facility] Follow up.   Specialty:  Behavioral Health Why:  They will reach out to you to schedule a follow-up appointment. Your records have been faxed over. Thank you! Contact information: [place]. [place] [phone]          Follow-up recommendations:  Activity:  as tolerated Diet:  low sodium heart healthy Other:  keep follow up appointments  Comments:    Signed: Orson Slick, MD 10/11/2018, 9:25 AM

## 2018-10-11 NOTE — [facility]HH Suicide Risk Assessment (Signed)
[facility] Discharge Suicide Risk Assessment   Principal Problem: Severe recurrent major depressive disorder with psychotic features [facility]) Discharge Diagnoses: Principal Problem:   Severe recurrent major depressive disorder with psychotic features ([place]) Active Problems:   Hypothyroidism   OCD (obsessive compulsive disorder)   Total Time spent with patient: 20 minutes  Musculoskeletal: Strength & Muscle Tone: within normal limits Gait & Station: normal Patient leans: N/A  Psychiatric Specialty Exam: Review of Systems  Neurological: Negative.   Psychiatric/Behavioral: Negative.   All other systems reviewed and are negative.   Blood pressure (!) 146/88, pulse (!) 103, temperature 98.3 F (36.8 C), temperature source Oral, resp. rate 16, SpO2 100 %.There is no height or weight on file to calculate BMI.  General Appearance: Casual  Eye Contact::  Good  Speech:  Clear and Coherent409  Volume:  Normal  Mood:  Euthymic  Affect:  Appropriate  Thought Process:  Goal Directed and Descriptions of Associations: Intact  Orientation:  Full (Time, Place, and Person)  Thought Content:  WDL  Suicidal Thoughts:  No  Homicidal Thoughts:  No  Memory:  Immediate;   Fair Recent;   Fair Remote;   Fair  Judgement:  Impaired  Insight:  Present  Psychomotor Activity:  Normal  Concentration:  Fair  Recall:  [place]  Language: Fair  Akathisia:  No  Handed:  Right  AIMS (if indicated):     Assets:  Communication Skills Desire for Improvement Financial Resources/Insurance Housing Physical Health Resilience Social Support Transportation Vocational/Educational  Sleep:  Number of Hours: 7  Cognition: WNL  ADL's:  Intact   Mental Status Per Nursing Assessment::   On Admission:  NA  Demographic Factors:  Male and Low socioeconomic status  Loss Factors: Decrease in vocational status, Loss of significant relationship and Financial problems/change in socioeconomic  status  Historical Factors: Impulsivity  Risk Reduction Factors:   Sense of responsibility to family, Employed, Living with another person, especially a relative and Positive therapeutic relationship  Continued Clinical Symptoms:  Depression:   Impulsivity Alcohol/Substance Abuse/Dependencies  Cognitive Features That Contribute To Risk:  None    Suicide Risk:  Minimal: No identifiable suicidal ideation.  Patients presenting with no risk factors but with morbid ruminations; may be classified as minimal risk based on the severity of the depressive symptoms  Follow-up Information    [facility] Follow up.   Specialty:  Behavioral Health Why:  They will reach out to you to schedule a follow-up appointment. Your records have been faxed over. Thank you! Contact information: [place]. [place] [phone]          Plan Of Care/Follow-up recommendations:  Activity:  as tolerated Diet:  low sodium heart healthy Other:  keep follow up appointments  Orson Slick, MD 10/11/2018, 9:20 AM

## 2018-10-11 NOTE — Progress Notes (Signed)
Recreation Therapy Notes   Date: 10/11/2018  Time: 9:30 am  Location: Craft Room  Behavioral response: Appropriate  Intervention Topic: Goals  Discussion/Intervention:  Group content on today was focused on goals. Patients described what goals are and how they define goals. Individuals expressed how they go about setting goals and reaching them. The group identified how important goals are and if they make short term goals to reach long term goals. Patients described how many goals they work on at a time and what affects them not reaching their goal. Individuals described how much time they put into planning and obtaining their goals. The group participated in the intervention "My Goal Board" and made personal goal boards to help them achieve their goal. Clinical Observations/Feedback:  Patient came to group and explained that when he known he is working toward something it is easy to stay optimistic. He defined goals as method or a plan.  Individual was social with peers and staff while participating in the intervention.  Gelena Klosinski LRT/CTRS         Selisa Tensley 10/11/2018 11:27 AM

## 2018-10-26 ENCOUNTER — Encounter (HOSPITAL_COMMUNITY): Payer: Self-pay | Admitting: Emergency Medicine

## 2018-10-26 ENCOUNTER — Other Ambulatory Visit: Payer: Self-pay

## 2018-10-26 ENCOUNTER — Emergency Department (HOSPITAL_COMMUNITY)
Admission: EM | Admit: 2018-10-26 | Discharge: 2018-10-27 | Disposition: A | Discharge status: Home / Self Care | Payer: BLUE CROSS/BLUE SHIELD | Attending: Emergency Medicine | Admitting: Emergency Medicine

## 2018-10-26 DIAGNOSIS — Z79899 Other long term (current) drug therapy: Secondary | ICD-10-CM | POA: Diagnosis not present

## 2018-10-26 DIAGNOSIS — F331 Major depressive disorder, recurrent, moderate: Secondary | ICD-10-CM | POA: Diagnosis not present

## 2018-10-26 DIAGNOSIS — E039 Hypothyroidism, unspecified: Secondary | ICD-10-CM | POA: Insufficient documentation

## 2018-10-26 DIAGNOSIS — F329 Major depressive disorder, single episode, unspecified: Secondary | ICD-10-CM | POA: Diagnosis present

## 2018-10-26 DIAGNOSIS — F32A Depression, unspecified: Secondary | ICD-10-CM

## 2018-10-26 LAB — ACETAMINOPHEN LEVEL: Acetaminophen (Tylenol), Serum: <10 ug/mL — ABNORMAL LOW (ref 10–30)

## 2018-10-26 LAB — CBC WITH DIFFERENTIAL/PLATELET
Abs Immature Granulocytes: 0.04 10*3/uL (ref 0.00–0.07)
Basophils Absolute: 0 10*3/uL (ref 0.0–0.1)
Basophils Relative: 0 %
EOS ABS: 0.2 10*3/uL (ref 0.0–0.5)
Eosinophils Relative: 3 %
HCT: 39.3 % (ref 39.0–52.0)
Hemoglobin: 12.9 g/dL — ABNORMAL LOW (ref 13.0–17.0)
IMMATURE GRANULOCYTES: 1 %
Lymphocytes Relative: 35 %
Lymphs Abs: 2.1 10*3/uL (ref 0.7–4.0)
MCH: 28.9 pg (ref 26.0–34.0)
MCHC: 32.8 g/dL (ref 30.0–36.0)
MCV: 88.1 fL (ref 80.0–100.0)
Monocytes Absolute: 0.4 10*3/uL (ref 0.1–1.0)
Monocytes Relative: 7 %
NRBC: 0 % (ref 0.0–0.2)
Neutro Abs: 3.2 10*3/uL (ref 1.7–7.7)
Neutrophils Relative %: 54 %
Platelets: 287 10*3/uL (ref 150–400)
RBC: 4.46 MIL/uL (ref 4.22–5.81)
RDW: 14.5 % (ref 11.5–15.5)
WBC: 5.9 10*3/uL (ref 4.0–10.5)

## 2018-10-26 LAB — RAPID URINE DRUG SCREEN, HOSP PERFORMED
Amphetamines: NOT DETECTED
Barbiturates: NOT DETECTED
Benzodiazepines: NOT DETECTED
Cocaine: NOT DETECTED
Opiates: NOT DETECTED
Tetrahydrocannabinol: NOT DETECTED

## 2018-10-26 LAB — BASIC METABOLIC PANEL
Anion gap: 8 (ref 5–15)
BUN: 11 mg/dL (ref 6–20)
CO2: 27 mmol/L (ref 22–32)
Calcium: 8.9 mg/dL (ref 8.9–10.3)
Chloride: 102 mmol/L (ref 98–111)
Creatinine, Ser: 0.96 mg/dL (ref 0.61–1.24)
GFR calc Af Amer: >60 mL/min (ref >60)
GLUCOSE: 95 mg/dL (ref 70–99)
Potassium: 3.7 mmol/L (ref 3.5–5.1)
Sodium: 137 mmol/L (ref 135–145)

## 2018-10-26 LAB — SALICYLATE LEVEL: Salicylate Lvl: <7 mg/dL (ref 2.8–30.0)

## 2018-10-26 LAB — ETHANOL: Alcohol, Ethyl (B): <10 mg/dL (ref <10)

## 2018-10-26 MED ORDER — FLUVOXAMINE MALEATE 50 MG PO TABS
100.0000 mg | ORAL_TABLET | Freq: Every day | ORAL | Status: DC
  Administered 2018-10-27: 100 mg via ORAL
  Filled 2018-10-26: qty 2

## 2018-10-26 MED ORDER — TRAZODONE HCL 50 MG PO TABS
50.0000 mg | ORAL_TABLET | Freq: Every day | ORAL | Status: DC
  Administered 2018-10-27: 50 mg via ORAL
  Filled 2018-10-26: qty 1

## 2018-10-26 MED ORDER — RISPERIDONE 2 MG PO TABS
2.0000 mg | ORAL_TABLET | Freq: Every day | ORAL | Status: DC
  Administered 2018-10-27: 2 mg via ORAL
  Filled 2018-10-26: qty 1

## 2018-10-26 MED ORDER — HYDROXYZINE HCL 25 MG PO TABS: 50.0000 mg | ORAL_TABLET | Freq: Three times a day (TID) | ORAL | Status: DC | PRN

## 2018-10-26 MED ORDER — LEVOTHYROXINE SODIUM 50 MCG PO TABS
50.0000 ug | ORAL_TABLET | Freq: Every day | ORAL | Status: DC
  Administered 2018-10-27: 50 ug via ORAL
  Filled 2018-10-26: qty 1

## 2018-10-26 NOTE — ED Notes (Signed)
Bed: [facility] Expected date:  Expected time:  Means of arrival:  Comments: 

## 2018-10-26 NOTE — ED Triage Notes (Addendum)
Patient c/o worsening depression x2 days. States "I don't know if I need my medicines adjusted." Reports taking meds as prescribed. Denies SI/HI. Hx anxiety, depression, and OCD. States he recently left home because he "didn't feel safe."

## 2018-10-26 NOTE — ED Notes (Signed)
Pt alert and cooperative. Pt denies any pain or discomfort. Pt reports he's depressed. Pt denies any si, hi ,or avh at this time. Pt safe, will continue to monitor.

## 2018-10-26 NOTE — ED Provider Notes (Signed)
[facility] DEPT Provider Note   CSN: [id number] Arrival date & time: 10/26/18  2001    History   Chief Complaint Chief Complaint  Patient presents with  . Depression    HPI Jamie Rivers is a 38 y.o. male.     HPI  Pt was seen at 2055. Per pt, c/o gradual onset and worsening of persistent "depression" for the past week. Pt states he believes his "meds aren't working." Pt does "not feel it matters if he lives." States he left home become he "didn't feel safe." Denies SA, no HI, no AVH.     Past Medical History:  Diagnosis Date  . Anxiety   . Renal disorder 04/22/2016   Renal Cell Carcinoma    Patient Active Problem List   Diagnosis Date Noted  . Severe recurrent major depressive disorder with psychotic features ([place]) 10/08/2018  . Hypothyroidism 10/08/2018  . OCD (obsessive compulsive disorder) 10/08/2018  . Neoplasm of left kidney 06/01/2016    Past Surgical History:  Procedure Laterality Date  . bone graft surgery from right hip to support tooth      age 28   . brain tumor surgery      age 66 per patient - benign per patient   . CLEFT PALATE REPAIR    . closure of roof of mouth surgery      age 73   . ROBOTIC ASSITED PARTIAL NEPHRECTOMY Left 06/01/2016   Procedure: XI ROBOTIC ASSITED PARTIAL NEPHRECTOMY;  Surgeon: Raynelle Bring, MD;  Location: [facility];  Service: Urology;  Laterality: Left;        Home Medications    Prior to Admission medications   Medication Sig Start Date End Date Taking? Authorizing Provider  fluvoxaMINE (LUVOX) 100 MG tablet Take 1 tablet (100 mg total) by mouth at bedtime. 10/11/18  Yes Pucilowska, [name], MD  hydrOXYzine (ATARAX/VISTARIL) 50 MG tablet Take 1 tablet (50 mg total) by mouth 3 (three) times daily as needed for anxiety. 10/11/18  Yes Pucilowska, [name], MD  levothyroxine (SYNTHROID, LEVOTHROID) 50 MCG tablet Take 1 tablet (50 mcg total) by mouth daily at 6 (six) AM. 10/12/18  Yes  Pucilowska, [name], MD  risperiDONE (RISPERDAL) 2 MG tablet Take 1 tablet (2 mg total) by mouth at bedtime. 10/12/18  Yes Pucilowska, [name], MD  traZODone (DESYREL) 50 MG tablet Take 1 tablet (50 mg total) by mouth at bedtime. 10/11/18  Yes Pucilowska, Wardell Honour, MD    Family History Family History  Problem Relation Age of Onset  . Hypertension Mother     Social History Social History   Tobacco Use  . Smoking status: Never Smoker  . Smokeless tobacco: Never Used  Substance Use Topics  . Alcohol use: Not Currently    Comment: 4-5 nites per week has beer- 2-4 beers per nite   . Drug use: No     Allergies   Patient has no known allergies.   Review of Systems Review of Systems ROS: Statement: All systems negative except as marked or noted in the HPI; Constitutional: Negative for fever and chills. ; ; Eyes: Negative for eye pain, redness and discharge. ; ; ENMT: Negative for ear pain, hoarseness, nasal congestion, sinus pressure and sore throat. ; ; Cardiovascular: Negative for chest pain, palpitations, diaphoresis, dyspnea and peripheral edema. ; ; Respiratory: Negative for cough, wheezing and stridor. ; ; Gastrointestinal: Negative for nausea, vomiting, diarrhea, abdominal pain, blood in stool, hematemesis, jaundice and rectal bleeding. . ; ;  Genitourinary: Negative for dysuria, flank pain and hematuria. ; ; Musculoskeletal: Negative for back pain and neck pain. Negative for swelling and trauma.; ; Skin: Negative for pruritus, rash, abrasions, blisters, bruising and skin lesion.; ; Neuro: Negative for headache, lightheadedness and neck stiffness. Negative for weakness, altered level of consciousness, altered mental status, extremity weakness, paresthesias, involuntary movement, seizure and syncope.; Psych: +depression, vague. No SA, no HI, no hallucinations.     Physical Exam Updated Vital Signs BP (!) 143/100 (BP Location: Left Arm)   Pulse 71   Temp 97.7 F (36.5 C) (Oral)    Resp 20   SpO2 100%   Physical Exam 2100: Physical examination:  Nursing notes reviewed; Vital signs and O2 SAT reviewed;  Constitutional: Well developed, Well nourished, Well hydrated, In no acute distress; Head:  Normocephalic, atraumatic; Eyes: EOMI, PERRL, No scleral icterus; ENMT: Mouth and pharynx normal, Mucous membranes moist; Neck: Supple, Full range of motion; Cardiovascular: Regular rate and rhythm; Respiratory: Breath sounds clear, No wheezes.  Speaking full sentences with ease, Normal respiratory effort/excursion; Chest: No deformity, Movement normal; Abdomen: Nondistended; Extremities: No deformity.; Neuro: AA&Ox3, Major CN grossly intact.  Speech clear. No gross focal motor deficits in extremities. Climbs on and off stretcher easily by himself. Gait steady.; Skin: Color normal, Warm, Dry.; Psych:  Affect flat.    ED Treatments / Results  Labs (all labs ordered are listed, but only abnormal results are displayed)   EKG None  Radiology   Procedures Procedures (including critical care time)  Medications Ordered in ED Medications - No data to display   Initial Impression / Assessment and Plan / ED Course  I have reviewed the triage vital signs and the nursing notes.  Pertinent labs & imaging results that were available during my care of the patient were reviewed by me and considered in my medical decision making (see chart for details).     MDM Reviewed: nursing note, previous chart and vitals Reviewed previous: labs Interpretation: labs   Results for orders placed or performed during the hospital encounter of 10/26/18  Acetaminophen level  Result Value Ref Range   Acetaminophen (Tylenol), Serum <10 (L) 10 - 30 ug/mL  Basic metabolic panel  Result Value Ref Range   Sodium 137 135 - 145 mmol/L   Potassium 3.7 3.5 - 5.1 mmol/L   Chloride 102 98 - 111 mmol/L   CO2 27 22 - 32 mmol/L   Glucose, Bld 95 70 - 99 mg/dL   BUN 11 6 - 20 mg/dL   Creatinine, Ser 0.96  0.61 - 1.24 mg/dL   Calcium 8.9 8.9 - 10.3 mg/dL   GFR calc non Af Amer >60 >60 mL/min   GFR calc Af Amer >60 >60 mL/min   Anion gap 8 5 - 15  Ethanol  Result Value Ref Range   Alcohol, Ethyl (B) <62 <95 mg/dL  Salicylate level  Result Value Ref Range   Salicylate Lvl <2.8 2.8 - 30.0 mg/dL  CBC with Differential  Result Value Ref Range   WBC 5.9 4.0 - 10.5 K/uL   RBC 4.46 4.22 - 5.81 MIL/uL   Hemoglobin 12.9 (L) 13.0 - 17.0 g/dL   HCT 39.3 39.0 - 52.0 %   MCV 88.1 80.0 - 100.0 fL   MCH 28.9 26.0 - 34.0 pg   MCHC 32.8 30.0 - 36.0 g/dL   RDW 14.5 11.5 - 15.5 %   Platelets 287 150 - 400 K/uL   nRBC 0.0 0.0 - 0.2 %  Neutrophils Relative % 54 %   Neutro Abs 3.2 1.7 - 7.7 K/uL   Lymphocytes Relative 35 %   Lymphs Abs 2.1 0.7 - 4.0 K/uL   Monocytes Relative 7 %   Monocytes Absolute 0.4 0.1 - 1.0 K/uL   Eosinophils Relative 3 %   Eosinophils Absolute 0.2 0.0 - 0.5 K/uL   Basophils Relative 0 %   Basophils Absolute 0.0 0.0 - 0.1 K/uL   Immature Granulocytes 1 %   Abs Immature Granulocytes 0.04 0.00 - 0.07 K/uL  Urine rapid drug screen (hosp performed)  Result Value Ref Range   Opiates NONE DETECTED NONE DETECTED   Cocaine NONE DETECTED NONE DETECTED   Benzodiazepines NONE DETECTED NONE DETECTED   Amphetamines NONE DETECTED NONE DETECTED   Tetrahydrocannabinol NONE DETECTED NONE DETECTED   Barbiturates NONE DETECTED NONE DETECTED    2330:  Will have TTS evaluate. Holding orders written.      Final Clinical Impressions(s) / ED Diagnoses   Final diagnoses:  Depression, unspecified depression type    ED Discharge Orders    None       Francine Graven, DO 10/26/18 2338

## 2018-10-26 NOTE — BH Assessment (Addendum)
Assessment Note  Jamie Rivers is an 38 y.o. male.  -Clinician reviewed note by Per pt, c/o gradual onset and worsening of persistent "depression" for the past week. Pt states he believes his "meds aren't working." Pt does "not feel it matters if he lives." States he left home become he "didn't feel safe." Denies SA, no HI, no AVH  Patient has been staying at a [employer] for a few weeks.  He feels that it is a hostile environment for him.  He said that he felt uncomfortable staying there so a few days ago he stayed at a hotel.  He ran out of money for the hotel today and went back to the [place] to collect some of his things.  He took a nap while he was there and the manager told him he couldn't just come and go.  Patient then left and drove himself to [facility].  Patient told clinician, "I didn't have anywhere else to go."  Patient did not have any collateral contacts to reach out to at this time.  Patient says he has had increasing depression and feelings of paranoia.  He denies any current SI at this time.  Although he says he does not feel safe.  Patient says that he has no HI or A/V hallucinations.  Patient reported that he has a payday tomorrow and will be able to return to the hotel.  Otherwise patient is ostensibly homeless.  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  He denies other drug use.  Patient receives services for outpatient care through [facility].  Patient says he does think that his medications are off a little. Patient says he feels he can follow up w/ outpatient care upon d/c.  Patient was at [facility] in January.  -Clinician discussed patient care with Patriciaann Clan, PA.  He recommended pt to be observed overnight and discharged in the morning.  Clinician informed Dr. Christy Gentles of disposition.  Diagnosis: MDD recurrent, moderate; OCD  Past Medical History:  Past Medical History:  Diagnosis Date  . Anxiety   . Renal  disorder 04/22/2016   Renal Cell Carcinoma    Past Surgical History:  Procedure Laterality Date  . bone graft surgery from right hip to support tooth      age 34   . brain tumor surgery      age 16 per patient - benign per patient   . CLEFT PALATE REPAIR    . closure of roof of mouth surgery      age 51   . ROBOTIC ASSITED PARTIAL NEPHRECTOMY Left 06/01/2016   Procedure: XI ROBOTIC ASSITED PARTIAL NEPHRECTOMY;  Surgeon: Raynelle Bring, MD;  Location[facility];  Service: Urology;  Laterality: Left;    Family History:  Family History  Problem Relation Age of Onset  . Hypertension Mother     Social History:  reports that he has never smoked. He has never used smokeless tobacco. He reports previous alcohol use. He reports that he does not use drugs.  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  CIWA: CIWA-Ar BP: (!) 143/100 Pulse Rate: 71 COWS:    Allergies: No Known Allergies  Home Medications: (Not in a hospital admission)   OB/GYN Status:  No LMP for male patient.  General Assessment Data [facility] of [facility]: [facility] TTS Assessment: In system Is this a Tele or Face-to-Face Assessment?: Face-to-Face Is this an Initial Assessment or a Re-assessment for this encounter?: Initial Assessment Patient Accompanied by:: N/A Language Other than English: No Living Arrangements: Homeless/Shelter What gender do you identify as?: Male Marital status: Single Pregnancy Status: No Living Arrangements: Other (Comment)(Homeless) Can pt return to current living arrangement?: Yes Admission Status: Voluntary Is patient capable of signing voluntary admission?: Yes Referral Source: Self/Family/Friend(Pt drove himself to  [facility].) Insurance type: BC/BS     Crisis Care Plan Living Arrangements: Other (Comment)(Homeless) Name of Psychiatrist: Jimmye Norman, [place] @ [facility] Name of Therapist: Wende Bushy at [facility]  Education Status Is patient currently in school?: No Highest grade of school patient has completed: Master in Music Is the patient employed, unemployed or receiving disability?: Employed  Risk to self with the past 6 months Suicidal Ideation: No-Not Currently/Within Last 6 Months Has patient been a risk to self within the past 6 months prior to admission? : No Suicidal Intent: No Has patient had any suicidal intent within the past 6 months prior to admission? : No Is patient at risk for suicide?: No Suicidal Plan?: No Has patient had any suicidal plan within the past 6 months prior to admission? : No Access to Means: No What has been your use of drugs/alcohol within the last 12 months?: ETOH Previous Attempts/Gestures: No How many times?: 0 Other Self Harm Risks: None Triggers for Past Attempts: None known Intentional Self Injurious Behavior: None Family Suicide History: Yes(Mother's cousin) Recent stressful life event(s): Financial Problems, Turmoil (Comment)(Being in [place]) Persecutory voices/beliefs?: Yes Depression: Yes Depression Symptoms: Despondent, Insomnia, Isolating, Loss of interest in usual pleasures, Feeling angry/irritable Substance abuse history and/or treatment for substance abuse?: Yes Suicide prevention information given to non-admitted patients: Not applicable  Risk to Others within the past 6 months Homicidal Ideation: No Does patient have any lifetime risk of violence toward others beyond the six months prior to admission? : No Thoughts of Harm to Others: No Current Homicidal Intent: No Current Homicidal Plan: No Access to Homicidal Means: No Identified Victim: No one History of harm to others?: No Assessment of Violence: None Noted Violent  Behavior Description: None reported Does patient have access to weapons?: No Criminal Charges Pending?: No Does patient have a court date: No Is patient on probation?: No  Psychosis Hallucinations: None noted Delusions: None noted  Mental Status Report Appearance/Hygiene: Unremarkable, In scrubs Eye Contact: Good Motor Activity: Freedom of movement, Unremarkable Speech: Logical/coherent Level of Consciousness: Alert Mood: Despair, Helpless, Sad Affect: Appropriate to circumstance Anxiety Level: Moderate Thought Processes: Coherent, Relevant Judgement: Unimpaired Orientation: Person, Situation, Place, Time Obsessive Compulsive Thoughts/Behaviors: Moderate  Cognitive Functioning Concentration: Normal Memory: Recent Intact, Remote Intact Is patient IDD: No Insight: Fair Impulse Control: Fair Appetite: Good Have you had any weight changes? : No Change Sleep: No Change Total Hours of Sleep: 8 Vegetative Symptoms: Decreased grooming  ADLScreening [facility] Assessment Services) Patient's cognitive ability adequate to safely complete daily activities?: Yes Patient able to express need for assistance with ADLs?: Yes Independently performs ADLs?: Yes (appropriate for developmental age)  Prior Inpatient Therapy Prior Inpatient Therapy: Yes Prior Therapy Dates: 2019; 09/2018 Prior Therapy Facilty/Provider(s): [facility]; [facility] Reason for Treatment: SA and depression  Prior Outpatient Therapy Prior Outpatient Therapy: Yes Prior Therapy Dates: current Prior  Therapy Facilty/Provider(s): [facility] Reason for Treatment: medication management Does patient have an ACCT team?: No Does patient have Intensive In-House Services?  : No Does patient have [facility] services? : No Does patient have P4CC services?: No  ADL Screening (condition at time of admission) Patient's cognitive ability adequate to safely complete daily activities?: Yes Is the patient deaf or have difficulty  hearing?: No Does the patient have difficulty seeing, even when wearing glasses/contacts?: Yes(Pt wears glasses.) Does the patient have difficulty concentrating, remembering, or making decisions?: Yes Patient able to express need for assistance with ADLs?: Yes Does the patient have difficulty dressing or bathing?: No Independently performs ADLs?: Yes (appropriate for developmental age) Does the patient have difficulty walking or climbing stairs?: No Weakness of Legs: None Weakness of Arms/Hands: None       Abuse/Neglect Assessment (Assessment to be complete while patient is alone) Abuse/Neglect Assessment Can Be Completed: Yes Physical Abuse: Denies Verbal Abuse: Yes, present (Comment)(Some bullying at the [facility]) Sexual Abuse: Denies Exploitation of patient/patient's resources: Denies Self-Neglect: Denies     Regulatory affairs officer (For Healthcare) Does Patient Have a Medical Advance Directive?: No Would patient like information on creating a medical advance directive?: No - Patient declined          Disposition:  Disposition Initial Assessment Completed for this Encounter: Yes Patient referred to: (To be reviewed w/ PA)  On Site Evaluation by:   Reviewed with Physician:    Raymondo Band 10/26/2018 11:27 PM

## 2018-10-27 DIAGNOSIS — F331 Major depressive disorder, recurrent, moderate: Secondary | ICD-10-CM | POA: Diagnosis present

## 2018-10-27 NOTE — BH Assessment (Signed)
[facility] Assessment Progress Note  Per Jinny Blossom, FNP, this pt does not require psychiatric hospitalization at this time.  Pt is to be discharged from [facility] with recommendation to continue treatment with [facility].  This has been included in pt's discharge instructions.  Pt's nurse, Nena Jordan, has been notified.  Jalene Mullet, [facility] Triage Specialist [phone]

## 2018-10-27 NOTE — Discharge Instructions (Signed)
For your behavioral health needs, you are advised to continue treatment with [facility]:       [facility]. [place] [phone]

## 2018-10-27 NOTE — Consult Note (Addendum)
[facility] Psych ED Discharge  10/27/2018 9:23 AM Jani Ploeger  MRN:  [id number] Principal Problem: MDD (major depressive disorder), recurrent episode, moderate ([place]) Discharge Diagnoses: Principal Problem:   MDD (major depressive disorder), recurrent episode, moderate ([place])   Subjective: Pt was seen and chart reviewed with treatment team and Dr Mariea Clonts. Pt denies suicidal/homicidal ideation, denies auditory/visual hallucinations and does not appear to be responding to internal stimuli. Pt stated [facility] is [facility]. He has an appointment there on 11-01-2018. He was inpatient at [facility] from 1/31 to 2/4 and discharged to follow up with [facility]. His medications were changed during his inpatient admission at [facility]. He stated he has been taking them as prescribed. He is advised to continue on his daily medications until he can return to his Dr at [facility]. While at [facility] he was found to have hypothyroidism and was started on Synthroid 50 mcg qd. He is advised to follow up with his PCP for ongoing care for his hypothyroidism.  He is stated he felt like his medications were off. He also stated he has been living at an [facility] but does not feel safe there. He works at [employer]. He is a Forensic psychologist. He stated he has to pick up his paycheck today and then he is going to go to a hotel until he can secure other living arrangements since he feels out of place at the [facility]. He is able to contract for safety upon discharge. He admits to drinking some alcohol yesterday but BAL and UDS are both negative. Pt is psychiatrically clear.   Total Time spent with patient: 30 minutes  Past Psychiatric History: As above  Past Medical History:  Past Medical History:  Diagnosis Date  . Anxiety   . Renal disorder 04/22/2016   Renal Cell Carcinoma    Past Surgical History:  Procedure  Laterality Date  . bone graft surgery from right hip to support tooth      age 14   . brain tumor surgery      age 39 per patient - benign per patient   . CLEFT PALATE REPAIR    . closure of roof of mouth surgery      age 11   . ROBOTIC ASSITED PARTIAL NEPHRECTOMY Left 06/01/2016   Procedure: XI ROBOTIC ASSITED PARTIAL NEPHRECTOMY;  Surgeon: Raynelle Bring, MD;  Location: [facility];  Service: Urology;  Laterality: Left;   Family History:  Family History  Problem Relation Age of Onset  . Hypertension Mother    Family Psychiatric  History: History of depression, anxiety, alcoholism, schizophrenia and suicide on his mother's side.   Social History:  Social History   Substance and Sexual Activity  Alcohol Use Not Currently   Comment: 4-5 nites per week has beer- 2-4 beers per nite      Social History   Substance and Sexual Activity  Drug Use No    Social History   Socioeconomic History  . Marital status: Single    Spouse name: Not on file  . Number of children: Not on file  . Years of education: Not on file  . Highest education level: Not on file  Occupational History  . Not on file  Social Needs  . Financial resource strain: Not on file  . Food insecurity:    Worry: Not on file    Inability: Not on file  .  Transportation needs:    Medical: Not on file    Non-medical: Not on file  Tobacco Use  . Smoking status: Never Smoker  . Smokeless tobacco: Never Used  Substance and Sexual Activity  . Alcohol use: Not Currently    Comment: 4-5 nites per week has beer- 2-4 beers per nite   . Drug use: No  . Sexual activity: Not Currently  Lifestyle  . Physical activity:    Days per week: Not on file    Minutes per session: Not on file  . Stress: Not on file  Relationships  . Social connections:    Talks on phone: Not on file    Gets together: Not on file    Attends religious service: Not on file    Active member of club or organization: Not on file    Attends meetings of  clubs or organizations: Not on file    Relationship status: Not on file  Other Topics Concern  . Not on file  Social History Narrative  . Not on file    Has this patient used any form of tobacco in the last 30 days? (Cigarettes, Smokeless Tobacco, Cigars, and/or Pipes) Prescription not provided because: Pt does not use tobacco.   Current Medications: Current Facility-Administered Medications  Medication Dose Route Frequency Provider Last Rate Last Dose  . fluvoxaMINE (LUVOX) tablet 100 mg  100 mg Oral QHS Francine Graven, DO   100 mg at 10/27/18 0110  . hydrOXYzine (ATARAX/VISTARIL) tablet 50 mg  50 mg Oral TID PRN Francine Graven, DO      . levothyroxine (SYNTHROID, LEVOTHROID) tablet 50 mcg  50 mcg Oral Q0600 Francine Graven, DO   50 mcg at 10/27/18 7793  . risperiDONE (RISPERDAL) tablet 2 mg  2 mg Oral QHS Francine Graven, DO   2 mg at 10/27/18 0111  . traZODone (DESYREL) tablet 50 mg  50 mg Oral QHS Francine Graven, DO   50 mg at 10/27/18 0110   Current Outpatient Medications  Medication Sig Dispense Refill  . fluvoxaMINE (LUVOX) 100 MG tablet Take 1 tablet (100 mg total) by mouth at bedtime. 30 tablet 1  . hydrOXYzine (ATARAX/VISTARIL) 50 MG tablet Take 1 tablet (50 mg total) by mouth 3 (three) times daily as needed for anxiety. 90 tablet 1  . levothyroxine (SYNTHROID, LEVOTHROID) 50 MCG tablet Take 1 tablet (50 mcg total) by mouth daily at 6 (six) AM. 30 tablet 1  . risperiDONE (RISPERDAL) 2 MG tablet Take 1 tablet (2 mg total) by mouth at bedtime. 30 tablet 1  . traZODone (DESYREL) 50 MG tablet Take 1 tablet (50 mg total) by mouth at bedtime. 30 tablet 1   PTA Medications: (Not in a hospital admission)   Musculoskeletal: Strength & Muscle Tone: within normal limits Gait & Station: Not observed Patient leans: N/A  Psychiatric Specialty Exam: Physical Exam  Nursing note and vitals reviewed. Constitutional: He is oriented to person, place, and time. He appears  well-developed and well-nourished.  HENT:  Head: Normocephalic and atraumatic.  Neck: Normal range of motion.  Respiratory: Effort normal.  Musculoskeletal: Normal range of motion.  Neurological: He is alert and oriented to person, place, and time.  Psychiatric: His speech is normal and behavior is normal. Judgment and thought content normal. Cognition and memory are normal. He exhibits a depressed mood.    Review of Systems  Psychiatric/Behavioral: Positive for depression and substance abuse (Pt self-reported occasional ETOH use.).  All other systems reviewed and are negative.  Blood pressure 124/86, pulse 98, temperature 97.9 F (36.6 C), temperature source Oral, resp. rate 16, SpO2 98 %.There is no height or weight on file to calculate BMI.  General Appearance: Casual  Eye Contact:  Good  Speech:  Clear and Coherent and Normal Rate  Volume:  Normal  Mood:  Depressed  Affect:  Congruent and Depressed  Thought Process:  Coherent, Goal Directed, Linear and Descriptions of Associations: Intact  Orientation:  Full (Time, Place, and Person)  Thought Content:  Logical  Suicidal Thoughts:  No  Homicidal Thoughts:  No  Memory:  Immediate;   Good Recent;   Good Remote;   Fair  Judgement:  Fair  Insight:  Fair  Psychomotor Activity:  Normal  Concentration:  Concentration: Good and Attention Span: Good  Recall:  Good  Fund of Knowledge:  Good  Language:  Good  Akathisia:  No  Handed:  Right  AIMS (if indicated):   N/A  Assets:  Research scientist (life sciences) Vocational/Educational  ADL's:  Intact  Cognition:  WNL  Sleep:   N/A     Demographic Factors:  Male and Adolescent or young adult  Loss Factors: Change in living situation  Historical Factors: Family history of mental illness or substance abuse  Risk Reduction Factors:   Sense of responsibility to family and Positive social support  Continued  Clinical Symptoms:  Depression:   Anhedonia Alcohol/Substance Abuse/Dependencies  Cognitive Features That Contribute To Risk:  Closed-mindedness    Suicide Risk:  Minimal: No identifiable suicidal ideation.  Patients presenting with no risk factors but with morbid ruminations; may be classified as minimal risk based on the severity of the depressive symptoms   Plan Of Care/Follow-up recommendations:  Activity:  as tolerated Diet:  heart healthy  Disposition and Treatment Plan: MDD (major depressive disorder), recurrent episode, moderate (HCC) Take all medications as prescribed by your outpatient providers. Keep all follow-up appointments as scheduled.  Do not consume alcohol or use illegal drugs while on prescription medications. Report any adverse effects from your medications to your primary care provider promptly.  In the event of recurrent symptoms or worsening symptoms, call 911, a crisis hotline, or go to the nearest emergency department for evaluation.   Ethelene Hal, NP 10/27/2018, 9:23 AM    Patient seen face-to-face for psychiatric evaluation, chart reviewed and case discussed with the physician extender and developed treatment plan. Reviewed the information documented and agree with the treatment plan.  Buford Dresser, DO 10/27/18 11:53 AM

## 2018-11-21 ENCOUNTER — Encounter (HOSPITAL_COMMUNITY): Payer: Self-pay | Admitting: Emergency Medicine

## 2018-11-21 ENCOUNTER — Other Ambulatory Visit: Payer: Self-pay

## 2018-11-21 DIAGNOSIS — R51 Headache: Secondary | ICD-10-CM | POA: Insufficient documentation

## 2018-11-21 DIAGNOSIS — Z79899 Other long term (current) drug therapy: Secondary | ICD-10-CM | POA: Insufficient documentation

## 2018-11-21 DIAGNOSIS — Z85528 Personal history of other malignant neoplasm of kidney: Secondary | ICD-10-CM | POA: Diagnosis not present

## 2018-11-21 DIAGNOSIS — F329 Major depressive disorder, single episode, unspecified: Secondary | ICD-10-CM | POA: Diagnosis not present

## 2018-11-21 DIAGNOSIS — F419 Anxiety disorder, unspecified: Secondary | ICD-10-CM | POA: Diagnosis not present

## 2018-11-21 DIAGNOSIS — E039 Hypothyroidism, unspecified: Secondary | ICD-10-CM | POA: Insufficient documentation

## 2018-11-21 DIAGNOSIS — H6692 Otitis media, unspecified, left ear: Secondary | ICD-10-CM | POA: Insufficient documentation

## 2018-11-21 DIAGNOSIS — R05 Cough: Secondary | ICD-10-CM | POA: Diagnosis present

## 2018-11-21 NOTE — ED Triage Notes (Addendum)
Patient is having a pan in upper middle abdomen, nausea, headache, and a cough. Patient states he is worried he has a sinus infection. Hx of OCD and anxiety. Patient this started a week and half ago.

## 2018-11-22 ENCOUNTER — Emergency Department (HOSPITAL_COMMUNITY): Payer: BLUE CROSS/BLUE SHIELD

## 2018-11-22 ENCOUNTER — Emergency Department (HOSPITAL_COMMUNITY)
Admission: EM | Admit: 2018-11-22 | Discharge: 2018-11-22 | Disposition: A | Discharge status: Home / Self Care | Payer: BLUE CROSS/BLUE SHIELD | Attending: Emergency Medicine | Admitting: Emergency Medicine

## 2018-11-22 ENCOUNTER — Other Ambulatory Visit: Payer: Self-pay

## 2018-11-22 ENCOUNTER — Encounter (HOSPITAL_COMMUNITY): Payer: Self-pay

## 2018-11-22 DIAGNOSIS — H6692 Otitis media, unspecified, left ear: Secondary | ICD-10-CM

## 2018-11-22 DIAGNOSIS — R059 Cough, unspecified: Secondary | ICD-10-CM

## 2018-11-22 DIAGNOSIS — H7292 Unspecified perforation of tympanic membrane, left ear: Secondary | ICD-10-CM

## 2018-11-22 DIAGNOSIS — R05 Cough: Secondary | ICD-10-CM

## 2018-11-22 LAB — COMPREHENSIVE METABOLIC PANEL
ALT: 33 U/L (ref 0–44)
AST: 22 U/L (ref 15–41)
Albumin: 4 g/dL (ref 3.5–5.0)
Alkaline Phosphatase: 65 U/L (ref 38–126)
Anion gap: 8 (ref 5–15)
BUN: 15 mg/dL (ref 6–20)
CO2: 25 mmol/L (ref 22–32)
Calcium: 9.1 mg/dL (ref 8.9–10.3)
Chloride: 105 mmol/L (ref 98–111)
Creatinine, Ser: 1.03 mg/dL (ref 0.61–1.24)
GFR calc Af Amer: >60 mL/min (ref >60)
GFR calc non Af Amer: >60 mL/min (ref >60)
Glucose, Bld: 118 mg/dL — ABNORMAL HIGH (ref 70–99)
POTASSIUM: 4 mmol/L (ref 3.5–5.1)
Sodium: 138 mmol/L (ref 135–145)
TOTAL PROTEIN: 7.4 g/dL (ref 6.5–8.1)
Total Bilirubin: 0.5 mg/dL (ref 0.3–1.2)

## 2018-11-22 LAB — INFLUENZA PANEL BY PCR (TYPE A & B)
Influenza A By PCR: NEGATIVE
Influenza B By PCR: NEGATIVE

## 2018-11-22 LAB — CBC WITH DIFFERENTIAL/PLATELET
Abs Immature Granulocytes: 0.03 10*3/uL (ref 0.00–0.07)
Basophils Absolute: 0 10*3/uL (ref 0.0–0.1)
Basophils Relative: 0 %
Eosinophils Absolute: 0.3 10*3/uL (ref 0.0–0.5)
Eosinophils Relative: 3 %
HCT: 36.4 % — ABNORMAL LOW (ref 39.0–52.0)
Hemoglobin: 11.9 g/dL — ABNORMAL LOW (ref 13.0–17.0)
Immature Granulocytes: 0 %
Lymphocytes Relative: 18 %
Lymphs Abs: 1.7 10*3/uL (ref 0.7–4.0)
MCH: 29.3 pg (ref 26.0–34.0)
MCHC: 32.7 g/dL (ref 30.0–36.0)
MCV: 89.7 fL (ref 80.0–100.0)
Monocytes Absolute: 0.8 10*3/uL (ref 0.1–1.0)
Monocytes Relative: 8 %
NRBC: 0 % (ref 0.0–0.2)
Neutro Abs: 6.7 10*3/uL (ref 1.7–7.7)
Neutrophils Relative %: 71 %
Platelets: 310 10*3/uL (ref 150–400)
RBC: 4.06 MIL/uL — ABNORMAL LOW (ref 4.22–5.81)
RDW: 14 % (ref 11.5–15.5)
WBC: 9.5 10*3/uL (ref 4.0–10.5)

## 2018-11-22 LAB — GROUP A STREP BY PCR: Group A Strep by PCR: NOT DETECTED

## 2018-11-22 LAB — LIPASE, BLOOD: Lipase: 34 U/L (ref 11–51)

## 2018-11-22 LAB — TROPONIN I
Troponin I: <0.03 ng/mL (ref <0.03)
Troponin I: <0.03 ng/mL (ref <0.03)

## 2018-11-22 MED ORDER — SODIUM CHLORIDE (PF) 0.9 % IJ SOLN
INTRAMUSCULAR | Status: AC
  Administered 2018-11-22: 02:00:00
  Filled 2018-11-22: qty 50

## 2018-11-22 MED ORDER — IOPAMIDOL (ISOVUE-300) INJECTION 61%
INTRAVENOUS | Status: AC
  Administered 2018-11-22: 100 mL via INTRAVENOUS
  Filled 2018-11-22: qty 100

## 2018-11-22 MED ORDER — CIPROFLOXACIN-DEXAMETHASONE 0.3-0.1 % OT SUSP: 4.0000 [drp] | Freq: Two times a day (BID) | OTIC | 0 refills | Status: AC

## 2018-11-22 MED ORDER — AMOXICILLIN 500 MG PO CAPS: 1000.0000 mg | ORAL_CAPSULE | Freq: Three times a day (TID) | ORAL | 0 refills | Status: AC

## 2018-11-22 MED ORDER — BENZONATATE 100 MG PO CAPS: 100.0000 mg | ORAL_CAPSULE | Freq: Three times a day (TID) | ORAL | 0 refills | Status: AC

## 2018-11-22 MED ORDER — IOPAMIDOL (ISOVUE-300) INJECTION 61%
100.0000 mL | Freq: Once | INTRAVENOUS | Status: AC | PRN
  Administered 2018-11-22: 100 mL via INTRAVENOUS

## 2018-11-22 NOTE — ED Notes (Signed)
To XR at this time

## 2018-11-22 NOTE — Discharge Instructions (Addendum)
Take the antibiotics as prescribed and follow-up with the ear doctor.  You likely had an ear infection caused a perforation of your eardrum.  Your x-ray is negative for pneumonia.  Your flu test is negative.  Return to the ED if he develop chest pain, shortness of breath or other concerns.

## 2018-11-22 NOTE — ED Provider Notes (Signed)
[facility] DEPT Provider Note   CSN: [id number] Arrival date & time: 11/21/18  2332    History   Chief Complaint Chief Complaint  Patient presents with   Cough    HPI Jamie Rivers is a 38 y.o. male.     Patient with history of depression, renal cell carcinoma, presenting with 1 week history of cough productive of clear and white mucus.  Is associate with nausea and pain in his lower chest and upper abdomen that is worse with coughing.  Associated with a headache and sore throat as well.  Denies fevers.  States he has pain in his upper abdomen and chest when he coughs and is worse when he tries to eat.  Did not had any vomiting or diarrhea.  No fever.  No recent other country travel.  No known exposure to coronavirus.  No other sick contacts.  The history is provided by the patient.  Cough  Associated symptoms: headaches, myalgias and rhinorrhea   Associated symptoms: no chest pain, no chills, no fever, no rash and no shortness of breath     Past Medical History:  Diagnosis Date   Anxiety    Renal disorder 04/22/2016   Renal Cell Carcinoma    Patient Active Problem List   Diagnosis Date Noted   MDD (major depressive disorder), recurrent episode, moderate ([place]) 10/27/2018   Severe recurrent major depressive disorder with psychotic features ([place]) 10/08/2018   Hypothyroidism 10/08/2018   OCD (obsessive compulsive disorder) 10/08/2018   Neoplasm of left kidney 06/01/2016    Past Surgical History:  Procedure Laterality Date   bone graft surgery from right hip to support tooth      age 89    brain tumor surgery      age 62 per patient - benign per patient    CLEFT PALATE REPAIR     closure of roof of mouth surgery      age 32    ROBOTIC 23 PARTIAL NEPHRECTOMY Left 06/01/2016   Procedure: XI ROBOTIC ASSITED PARTIAL NEPHRECTOMY;  Surgeon: Raynelle Bring, MD;  Location: [facility];  Service: Urology;  Laterality: Left;         Home Medications    Prior to Admission medications   Medication Sig Start Date End Date Taking? Authorizing Provider  fluvoxaMINE (LUVOX) 100 MG tablet Take 1 tablet (100 mg total) by mouth at bedtime. 10/11/18   Pucilowska, Herma Ard B, MD  hydrOXYzine (ATARAX/VISTARIL) 50 MG tablet Take 1 tablet (50 mg total) by mouth 3 (three) times daily as needed for anxiety. 10/11/18   Pucilowska, [name], MD  levothyroxine (SYNTHROID, LEVOTHROID) 50 MCG tablet Take 1 tablet (50 mcg total) by mouth daily at 6 (six) AM. 10/12/18   Pucilowska, [name], MD  risperiDONE (RISPERDAL) 2 MG tablet Take 1 tablet (2 mg total) by mouth at bedtime. 10/12/18   Pucilowska, Herma Ard B, MD  traZODone (DESYREL) 50 MG tablet Take 1 tablet (50 mg total) by mouth at bedtime. 10/11/18   Pucilowska, Wardell Honour, MD    Family History Family History  Problem Relation Age of Onset   Hypertension Mother     Social History Social History   Tobacco Use   Smoking status: Never Smoker   Smokeless tobacco: Never Used  Substance Use Topics   Alcohol use: Not Currently    Comment: 4-5 nites per week has beer- 2-4 beers per nite    Drug use: No     Allergies   Patient  has no known allergies.   Review of Systems Review of Systems  Constitutional: Negative for chills and fever.  HENT: Positive for congestion and rhinorrhea.   Respiratory: Positive for cough. Negative for chest tightness and shortness of breath.   Cardiovascular: Negative for chest pain.  Gastrointestinal: Positive for abdominal pain. Negative for nausea and vomiting.  Genitourinary: Negative for dysuria and hematuria.  Musculoskeletal: Positive for arthralgias and myalgias.  Skin: Negative for rash.  Neurological: Positive for weakness and headaches. Negative for dizziness and light-headedness.     all other systems are negative except as noted in the HPI and PMH.   Physical Exam Updated Vital Signs BP 139/88    Pulse 89    Temp 98.7 F  (37.1 C) (Oral)    Resp (!) 21    Ht 5\' 7"  (1.702 m)    Wt 63.5 kg    SpO2 99%    BMI 21.93 kg/m   Physical Exam Vitals signs and nursing note reviewed.  Constitutional:      General: He is not in acute distress.    Appearance: Normal appearance. He is well-developed and normal weight. He is not ill-appearing.  HENT:     Head: Normocephalic and atraumatic.     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     Mouth/Throat:     Mouth: Mucous membranes are moist.     Pharynx: Posterior oropharyngeal erythema present. No oropharyngeal exudate.     Comments: Previous cleft palate repair Eyes:     Conjunctiva/sclera: Conjunctivae normal.     Pupils: Pupils are equal, round, and reactive to light.  Neck:     Musculoskeletal: Normal range of motion and neck supple.     Comments: No meningismus. Cardiovascular:     Rate and Rhythm: Normal rate and regular rhythm.     Heart sounds: Normal heart sounds. No murmur.  Pulmonary:     Effort: Pulmonary effort is normal. No respiratory distress.     Breath sounds: Normal breath sounds.  Abdominal:     Palpations: Abdomen is soft.     Tenderness: There is abdominal tenderness. There is no guarding or rebound.     Comments: Mild epigastric tenderness  Musculoskeletal: Normal range of motion.        General: No tenderness.  Skin:    General: Skin is warm.     Capillary Refill: Capillary refill takes less than 2 seconds.  Neurological:     General: No focal deficit present.     Mental Status: He is alert and oriented to person, place, and time. Mental status is at baseline.     Cranial Nerves: No cranial nerve deficit.     Motor: No abnormal muscle tone.     Coordination: Coordination normal.     Comments:  5/5 strength throughout. CN 2-12 intact.Equal grip strength.   Psychiatric:         Behavior: Behavior normal.      ED Treatments / Results  Labs (all labs ordered are listed, but only abnormal results are displayed) Labs Reviewed  CBC WITH DIFFERENTIAL/PLATELET - Abnormal; Notable for the following components:      Result Value   RBC 4.06 (*)    Hemoglobin 11.9 (*)    HCT 36.4 (*)    All other components within normal  limits  COMPREHENSIVE METABOLIC PANEL - Abnormal; Notable for the following components:   Glucose, Bld 118 (*)    All other components within normal limits  GROUP A STREP BY PCR  LIPASE, BLOOD  TROPONIN I  INFLUENZA PANEL BY PCR (TYPE A & B)  TROPONIN I    EKG EKG Interpretation  Date/Time:  Tuesday November 22 2018 01:03:27 EDT Ventricular Rate:  93 PR Interval:    QRS Duration: 84 QT Interval:  342 QTC Calculation: 426 R Axis:   40 Text Interpretation:  Sinus rhythm No significant change was found Confirmed by Ezequiel Essex [phone]) on 11/22/2018 1:16:58 AM   Radiology Dg Chest 2 View  Result Date: 11/22/2018 CLINICAL DATA:  Cough EXAM: CHEST - 2 VIEW COMPARISON:  05/25/2018 FINDINGS: Heart and mediastinal contours are within normal limits. No focal opacities or effusions. No acute bony abnormality. IMPRESSION: No active cardiopulmonary disease. Electronically Signed   By: Rolm Baptise M.D.   On: 11/22/2018 00:49   Ct Head Wo Contrast  Result Date: 11/22/2018 CLINICAL DATA:  Headache, ear drainage EXAM: CT HEAD WITHOUT CONTRAST TECHNIQUE: Contiguous axial images were obtained from the base of the skull through the vertex without intravenous contrast. COMPARISON:  05/15/2016 FINDINGS: Brain: No acute intracranial abnormality. Specifically, no hemorrhage, hydrocephalus, mass lesion, acute infarction, or significant intracranial injury. Vascular: No hyperdense vessel or unexpected calcification. Skull: No acute calvarial abnormality. Sinuses/Orbits: Mucosal thickening noted in the left maxillary and sphenoid sinuses. Postoperative  changes noted along the anterior some maxillary sinus walls. Chronic opacification of the left mastoid air cells, stable. Other: None IMPRESSION: No acute intracranial abnormality. Chronic sinusitis changes as above. Stable chronic opacification of the left mastoid air cells. Electronically Signed   By: Rolm Baptise M.D.   On: 11/22/2018 02:33   Ct Abdomen Pelvis W Contrast  Result Date: 11/22/2018 CLINICAL DATA:  Right upper quadrant pain EXAM: CT ABDOMEN AND PELVIS WITH CONTRAST TECHNIQUE: Multidetector CT imaging of the abdomen and pelvis was performed using the standard protocol following bolus administration of intravenous contrast. CONTRAST:  132mL ISOVUE-300 IOPAMIDOL (ISOVUE-300) INJECTION 61% COMPARISON:  CT abdomen 06/29/2017 FINDINGS: LOWER CHEST: Right basilar atelectasis. HEPATOBILIARY: The hepatic contours and density are normal. There is no intra- or extrahepatic biliary dilatation. The gallbladder is normal. PANCREAS: The pancreatic parenchymal contours are normal and there is no ductal dilatation. There is no peripancreatic fluid collection. SPLEEN: Normal. ADRENALS/URINARY TRACT: --Adrenal glands: Normal. --Right kidney/ureter: No hydronephrosis, nephroureterolithiasis, perinephric stranding or solid renal mass. --Left kidney/ureter: Status post partial left nephrectomy. --Urinary bladder: Normal for degree of distention STOMACH/BOWEL: --Stomach/Duodenum: Small sliding-type hiatal hernia --Small bowel: No dilatation or inflammation. --Colon: No focal abnormality. --Appendix: Normal. VASCULAR/LYMPHATIC: Normal course and caliber of the major abdominal vessels. No abdominal or pelvic lymphadenopathy. REPRODUCTIVE: There are calcifications within the normal-sized prostate. Symmetric seminal vesicles. MUSCULOSKELETAL. No bony spinal canal stenosis or focal osseous abnormality. OTHER: None. IMPRESSION: 1. No evidence of acute cholecystitis. 2. Small hiatal hernia is new compared to the prior study.  3. Status post partial left nephrectomy without evidence of recurrent tumor. Electronically Signed   By: Ulyses Jarred M.D.   On: 11/22/2018 02:57    Procedures Procedures (including critical care time)  Medications Ordered in ED Medications - No data to display   Initial Impression / Assessment and Plan / ED Course  I have reviewed the triage vital signs and the nursing notes.  Pertinent labs & imaging results that were available during my care of the  patient were reviewed by me and considered in my medical decision making (see chart for details).       1 week of upper abdominal pain with congestion and cough.  EKG is sinus rhythm  Lungs are clear to auscultation.  Chest x-ray is negative  Work-up is reassuring.  Flu swab is negative.  Clear lungs to auscultation without wheezing. CT abdomen without acute finding.  Does show new hiatal hernia which may be contributing to his epigastric pain though it is likely more musculoskeletal from coughing.  Patient still having moist cough.  He states his ear issues are at baseline and he chronically has drainage and he was told in the past that his left TM was perforated which appears to be the case again today.  We will treat with prophylactic antibiotics for possible sinusitis and otitis as well as provide topical antibiotics for otitis externa. Patient will be instructed on PCP follow-up, referral to ENT regarding his ear problems, antipyretics and p.o. hydration at home.  Return precautions discussed.  Final Clinical Impressions(s) / ED Diagnoses   Final diagnoses:  Cough  Acute otitis media of left ear with perforation    ED Discharge Orders    None       Aashish Hamm, Annie Main, MD 11/22/18 [phone]

## 2019-04-20 IMAGING — CT CT ABDOMEN AND PELVIS WITH CONTRAST
2 of 4 series · 16 of 46 positions shown, 18 images · IV contrast (ISOVUE)
Comparison: CT abdomen 06/29/2017

CLINICAL DATA: Right upper quadrant pain

EXAM:
CT ABDOMEN AND PELVIS WITH CONTRAST
TECHNIQUE: Multidetector CT imaging of the abdomen and pelvis was performed
using the standard protocol following bolus administration of
intravenous contrast.
CONTRAST:  100mL OMOACU-C99 IOPAMIDOL (OMOACU-C99) INJECTION 61%

[Series 2: axial st · axial · 0.68mm/px · z∈[-866,-486]mm · 13 of 84 slices shown, 15 images]
[im 4/84  soft-tissue]
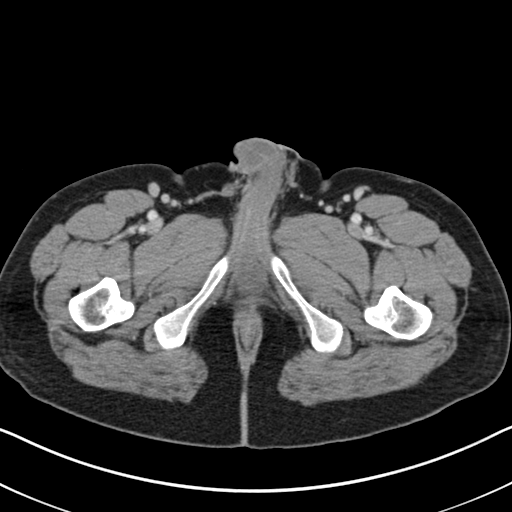
[im 4/84  bone]
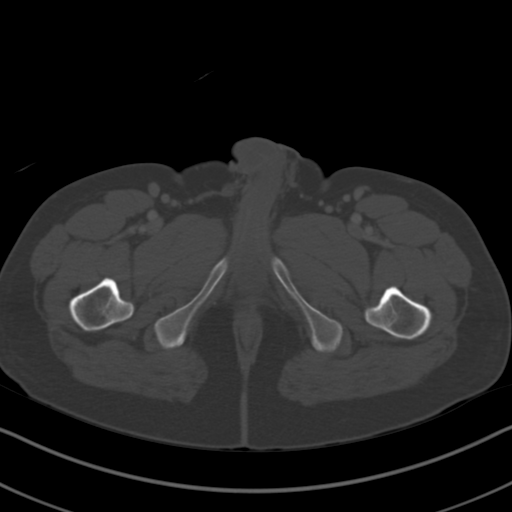
[im 11/84  soft-tissue]
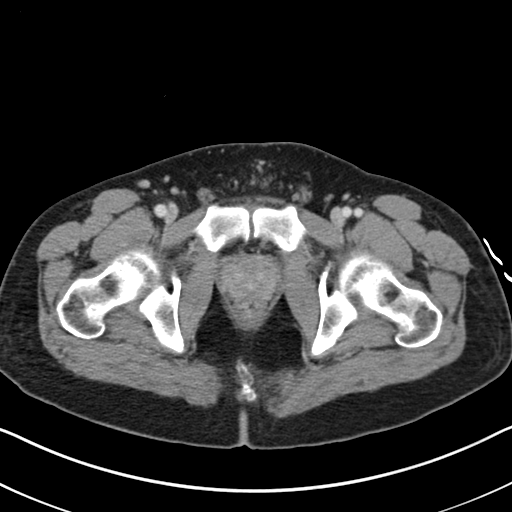
[im 19/84  soft-tissue]
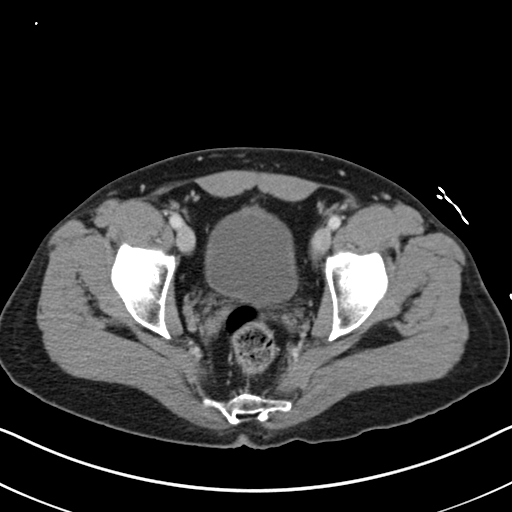
[im 22/84  soft-tissue]
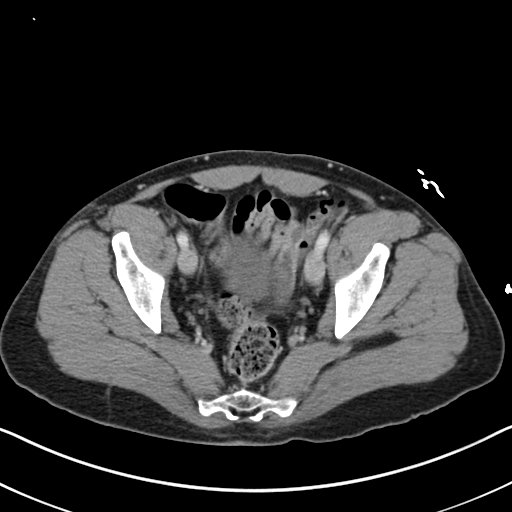
[im 29/84  soft-tissue]
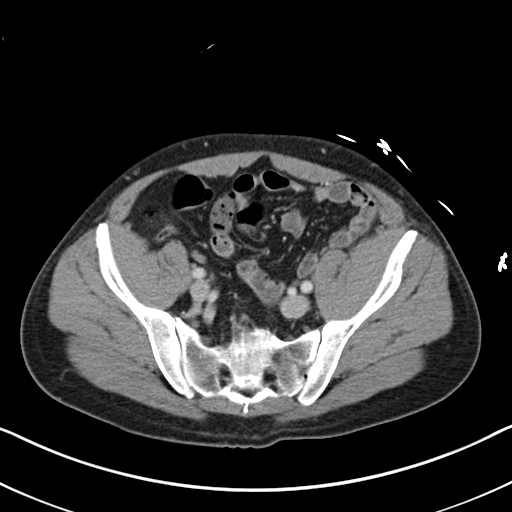
[im 37/84  soft-tissue]
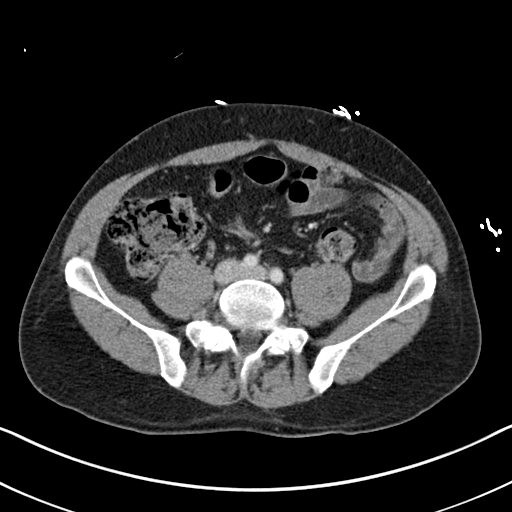
[im 44/84  soft-tissue]
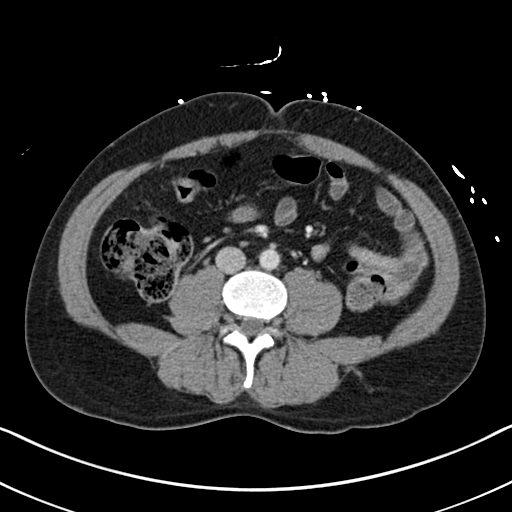
[im 47/84  soft-tissue]
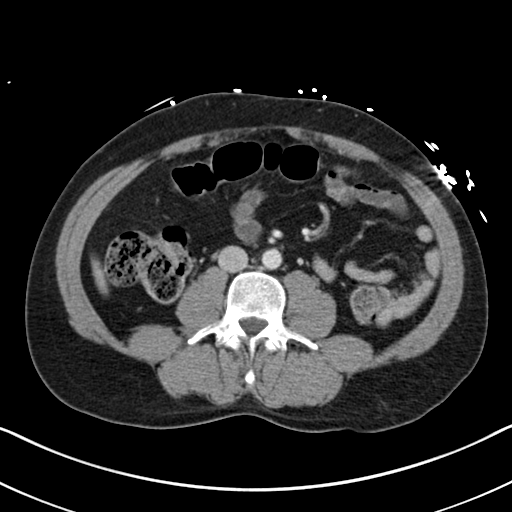
[im 55/84  soft-tissue]
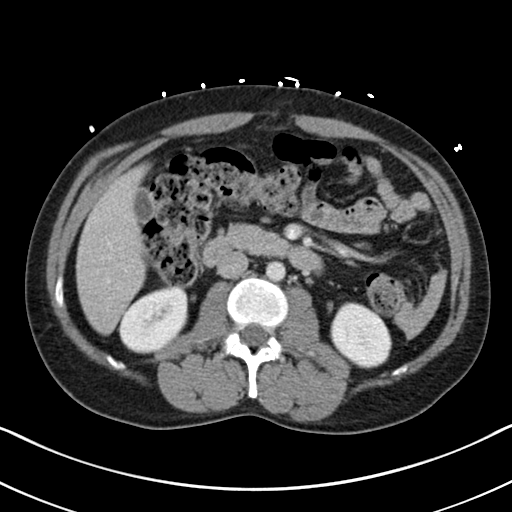
[im 55/84  bone]
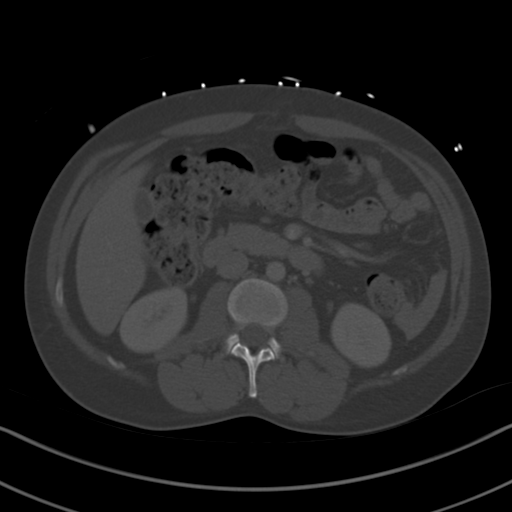
[im 62/84  soft-tissue]
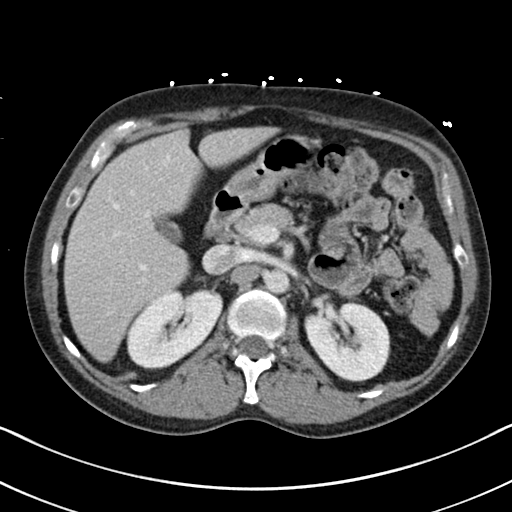
[im 65/84  soft-tissue]
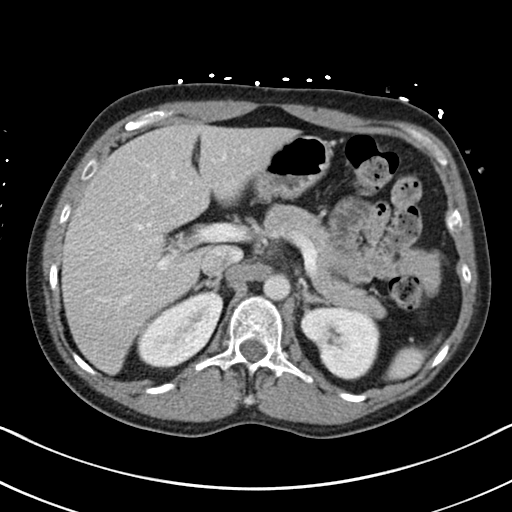
[im 73/84  soft-tissue]
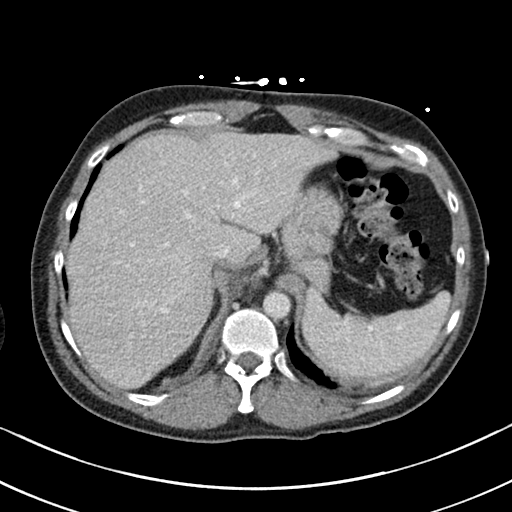
[im 80/84  soft-tissue]
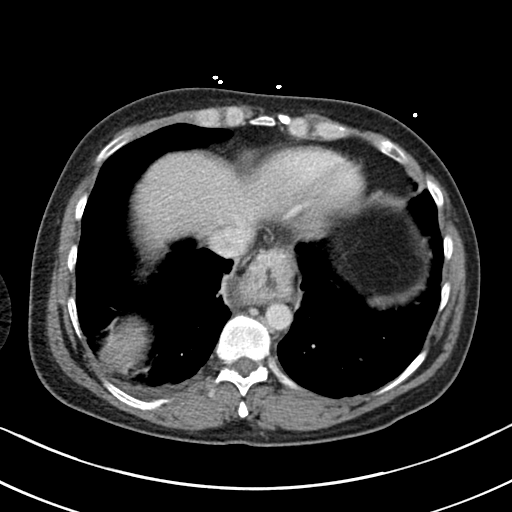

[Series 5: coronal st · coronal · 0.62mm/px · 3 of 122 slices shown]
[im 41/122  soft-tissue]
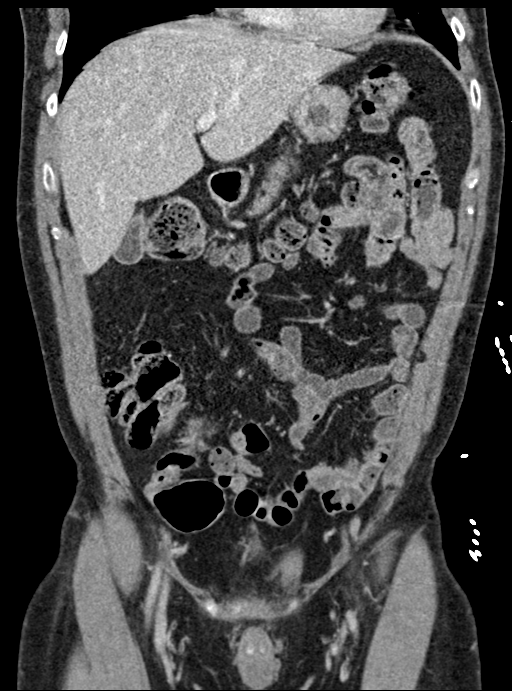
[im 54/122  soft-tissue]
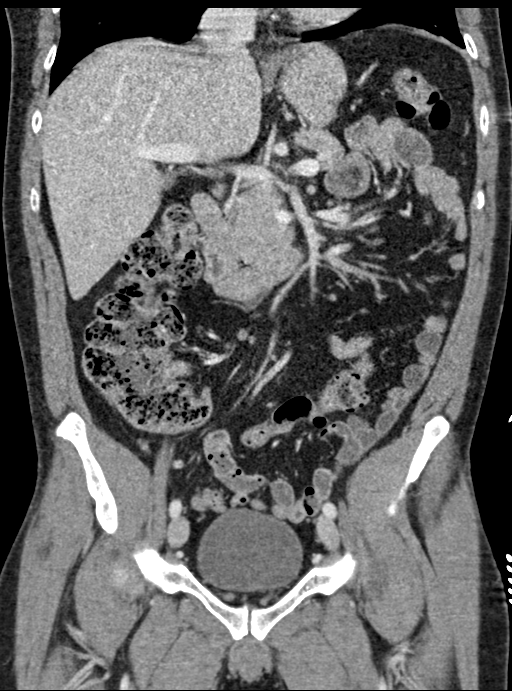
[im 68/122  soft-tissue]
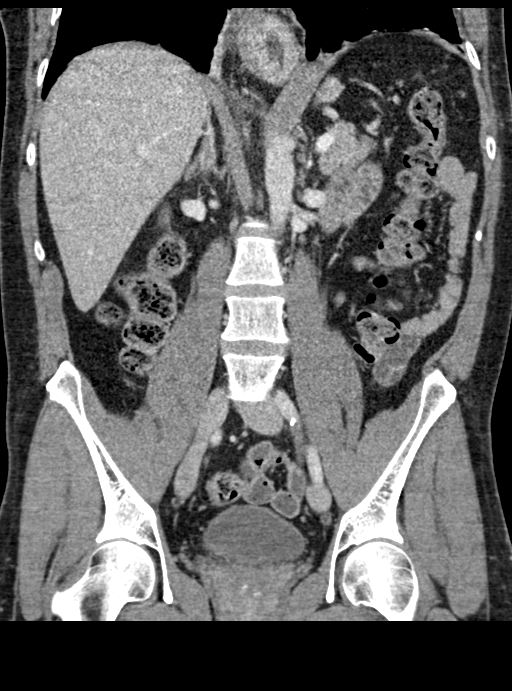

[16 of 46 positions shown; findings below may reference images not displayed]

FINDINGS: LOWER CHEST: Right basilar atelectasis.

HEPATOBILIARY: The hepatic contours and density are normal. There is
no intra- or extrahepatic biliary dilatation. The gallbladder is
normal.

PANCREAS: The pancreatic parenchymal contours are normal and there
is no ductal dilatation. There is no peripancreatic fluid
collection.

SPLEEN: Normal.

ADRENALS/URINARY TRACT:

--Adrenal glands: Normal.

--Right kidney/ureter: No hydronephrosis, nephroureterolithiasis,
perinephric stranding or solid renal mass.

--Left kidney/ureter: Status post partial left nephrectomy.

--Urinary bladder: Normal for degree of distention

STOMACH/BOWEL:

--Stomach/Duodenum: Small sliding-type hiatal hernia

--Small bowel: No dilatation or inflammation.

--Colon: No focal abnormality.

--Appendix: Normal.

VASCULAR/LYMPHATIC: Normal course and caliber of the major abdominal
vessels. No abdominal or pelvic lymphadenopathy.

REPRODUCTIVE: There are calcifications within the normal-sized
prostate. Symmetric seminal vesicles.

MUSCULOSKELETAL. No bony spinal canal stenosis or focal osseous
abnormality.

OTHER: None.
IMPRESSION: 1. No evidence of acute cholecystitis.
2. Small hiatal hernia is new compared to the prior study.
3. Status post partial left nephrectomy without evidence of
recurrent tumor.
